# Patient Record
Sex: Female | Born: 1935 | Race: White | Hispanic: No | Marital: Single | Smoking: Never smoker
Health system: Southern US, Community
[De-identification: ages and names within clinical notes are randomized; demographics above are authoritative.]

## PROBLEM LIST (undated history)

## (undated) DIAGNOSIS — I10 Essential (primary) hypertension: Secondary | ICD-10-CM

## (undated) DIAGNOSIS — E7800 Pure hypercholesterolemia, unspecified: Secondary | ICD-10-CM

---

## 2003-10-18 ENCOUNTER — Other Ambulatory Visit: Admission: RE | Admit: 2003-10-18 | Discharge: 2003-10-18 | Payer: Self-pay | Admitting: Internal Medicine

## 2003-10-21 ENCOUNTER — Ambulatory Visit (HOSPITAL_COMMUNITY): Admission: RE | Admit: 2003-10-21 | Discharge: 2003-10-21 | Payer: Self-pay | Admitting: Family Medicine

## 2005-02-22 ENCOUNTER — Ambulatory Visit (HOSPITAL_COMMUNITY): Admission: RE | Admit: 2005-02-22 | Discharge: 2005-02-22 | Payer: Self-pay | Admitting: Pulmonary Disease

## 2005-03-23 ENCOUNTER — Ambulatory Visit (HOSPITAL_COMMUNITY): Admission: RE | Admit: 2005-03-23 | Discharge: 2005-03-23 | Payer: Self-pay | Admitting: Pulmonary Disease

## 2005-07-26 ENCOUNTER — Ambulatory Visit (HOSPITAL_COMMUNITY): Admission: RE | Admit: 2005-07-26 | Discharge: 2005-07-26 | Payer: Self-pay | Admitting: Obstetrics & Gynecology

## 2008-02-17 ENCOUNTER — Ambulatory Visit (HOSPITAL_COMMUNITY): Admission: RE | Admit: 2008-02-17 | Discharge: 2008-02-17 | Payer: Self-pay | Admitting: Pulmonary Disease

## 2009-02-17 ENCOUNTER — Ambulatory Visit (HOSPITAL_COMMUNITY): Admission: RE | Admit: 2009-02-17 | Discharge: 2009-02-17 | Payer: Self-pay | Admitting: Pulmonary Disease

## 2010-02-23 ENCOUNTER — Ambulatory Visit (HOSPITAL_COMMUNITY): Admission: RE | Admit: 2010-02-23 | Discharge: 2010-02-23 | Payer: Self-pay | Admitting: Pulmonary Disease

## 2010-04-30 ENCOUNTER — Encounter: Payer: Self-pay | Admitting: Obstetrics & Gynecology

## 2011-02-13 ENCOUNTER — Other Ambulatory Visit (HOSPITAL_COMMUNITY): Payer: Self-pay | Admitting: Pulmonary Disease

## 2011-02-13 DIAGNOSIS — Z1231 Encounter for screening mammogram for malignant neoplasm of breast: Secondary | ICD-10-CM

## 2011-03-20 ENCOUNTER — Ambulatory Visit (HOSPITAL_COMMUNITY)
Admission: RE | Admit: 2011-03-20 | Discharge: 2011-03-20 | Disposition: A | Discharge status: Home / Self Care | Payer: Medicare Other | Source: Ambulatory Visit | Attending: Pulmonary Disease | Admitting: Pulmonary Disease

## 2011-03-20 DIAGNOSIS — Z1231 Encounter for screening mammogram for malignant neoplasm of breast: Secondary | ICD-10-CM | POA: Insufficient documentation

## 2011-05-17 ENCOUNTER — Other Ambulatory Visit: Payer: Self-pay | Admitting: Pulmonary Disease

## 2011-05-17 DIAGNOSIS — Z780 Asymptomatic menopausal state: Secondary | ICD-10-CM

## 2011-05-23 ENCOUNTER — Other Ambulatory Visit: Payer: Medicare Other

## 2011-05-23 ENCOUNTER — Ambulatory Visit
Admission: RE | Admit: 2011-05-23 | Discharge: 2011-05-23 | Disposition: A | Discharge status: Home / Self Care | Payer: Medicare Other | Source: Ambulatory Visit | Attending: Pulmonary Disease | Admitting: Pulmonary Disease

## 2011-05-23 DIAGNOSIS — Z780 Asymptomatic menopausal state: Secondary | ICD-10-CM

## 2012-03-12 ENCOUNTER — Other Ambulatory Visit (HOSPITAL_COMMUNITY): Payer: Self-pay | Admitting: Pulmonary Disease

## 2012-03-12 DIAGNOSIS — Z1231 Encounter for screening mammogram for malignant neoplasm of breast: Secondary | ICD-10-CM

## 2012-04-03 ENCOUNTER — Ambulatory Visit (HOSPITAL_COMMUNITY)
Admission: RE | Admit: 2012-04-03 | Discharge: 2012-04-03 | Disposition: A | Discharge status: Home / Self Care | Payer: Medicare Other | Source: Ambulatory Visit | Attending: Pulmonary Disease | Admitting: Pulmonary Disease

## 2012-04-03 DIAGNOSIS — Z1231 Encounter for screening mammogram for malignant neoplasm of breast: Secondary | ICD-10-CM | POA: Insufficient documentation

## 2012-09-30 ENCOUNTER — Other Ambulatory Visit: Payer: Self-pay | Admitting: Gastroenterology

## 2013-03-16 ENCOUNTER — Other Ambulatory Visit (HOSPITAL_COMMUNITY): Payer: Self-pay | Admitting: Pulmonary Disease

## 2013-03-16 DIAGNOSIS — Z1231 Encounter for screening mammogram for malignant neoplasm of breast: Secondary | ICD-10-CM

## 2013-04-07 ENCOUNTER — Ambulatory Visit (HOSPITAL_COMMUNITY)
Admission: RE | Admit: 2013-04-07 | Discharge: 2013-04-07 | Disposition: A | Discharge status: Home / Self Care | Payer: Medicare Other | Source: Ambulatory Visit | Attending: Pulmonary Disease | Admitting: Pulmonary Disease

## 2013-04-07 DIAGNOSIS — Z1231 Encounter for screening mammogram for malignant neoplasm of breast: Secondary | ICD-10-CM | POA: Insufficient documentation

## 2013-05-25 ENCOUNTER — Ambulatory Visit
Admission: RE | Admit: 2013-05-25 | Discharge: 2013-05-25 | Disposition: A | Discharge status: Home / Self Care | Payer: Medicare Other | Source: Ambulatory Visit | Attending: Pulmonary Disease | Admitting: Pulmonary Disease

## 2013-05-25 ENCOUNTER — Other Ambulatory Visit: Payer: Self-pay | Admitting: Pulmonary Disease

## 2013-05-25 DIAGNOSIS — M79659 Pain in unspecified thigh: Secondary | ICD-10-CM

## 2014-03-22 ENCOUNTER — Other Ambulatory Visit (HOSPITAL_COMMUNITY): Payer: Self-pay | Admitting: Pulmonary Disease

## 2014-03-22 DIAGNOSIS — Z1231 Encounter for screening mammogram for malignant neoplasm of breast: Secondary | ICD-10-CM

## 2014-04-08 ENCOUNTER — Ambulatory Visit (HOSPITAL_COMMUNITY): Payer: Medicare Other

## 2014-04-08 ENCOUNTER — Ambulatory Visit (HOSPITAL_COMMUNITY)
Admission: RE | Admit: 2014-04-08 | Discharge: 2014-04-08 | Disposition: A | Discharge status: Home / Self Care | Payer: Medicare Other | Source: Ambulatory Visit | Attending: Pulmonary Disease | Admitting: Pulmonary Disease

## 2014-04-08 DIAGNOSIS — Z1231 Encounter for screening mammogram for malignant neoplasm of breast: Secondary | ICD-10-CM | POA: Diagnosis present

## 2014-06-16 ENCOUNTER — Emergency Department (HOSPITAL_COMMUNITY): Payer: Medicare Other

## 2014-06-16 ENCOUNTER — Emergency Department (HOSPITAL_COMMUNITY)
Admission: EM | Admit: 2014-06-16 | Discharge: 2014-06-17 | Disposition: A | Discharge status: Home / Self Care | Payer: Medicare Other | Attending: Emergency Medicine | Admitting: Emergency Medicine

## 2014-06-16 ENCOUNTER — Encounter (HOSPITAL_COMMUNITY): Payer: Self-pay | Admitting: Emergency Medicine

## 2014-06-16 DIAGNOSIS — Z23 Encounter for immunization: Secondary | ICD-10-CM | POA: Diagnosis not present

## 2014-06-16 DIAGNOSIS — S0011XA Contusion of right eyelid and periocular area, initial encounter: Secondary | ICD-10-CM | POA: Diagnosis not present

## 2014-06-16 DIAGNOSIS — S0083XA Contusion of other part of head, initial encounter: Secondary | ICD-10-CM | POA: Diagnosis not present

## 2014-06-16 DIAGNOSIS — I10 Essential (primary) hypertension: Secondary | ICD-10-CM | POA: Insufficient documentation

## 2014-06-16 DIAGNOSIS — E780 Pure hypercholesterolemia: Secondary | ICD-10-CM | POA: Diagnosis not present

## 2014-06-16 DIAGNOSIS — S0012XA Contusion of left eyelid and periocular area, initial encounter: Secondary | ICD-10-CM | POA: Diagnosis not present

## 2014-06-16 DIAGNOSIS — Y92192 Bathroom in other specified residential institution as the place of occurrence of the external cause: Secondary | ICD-10-CM | POA: Insufficient documentation

## 2014-06-16 DIAGNOSIS — Z79899 Other long term (current) drug therapy: Secondary | ICD-10-CM | POA: Diagnosis not present

## 2014-06-16 DIAGNOSIS — S02402A Zygomatic fracture, unspecified, initial encounter for closed fracture: Secondary | ICD-10-CM | POA: Diagnosis not present

## 2014-06-16 DIAGNOSIS — Y9389 Activity, other specified: Secondary | ICD-10-CM | POA: Diagnosis not present

## 2014-06-16 DIAGNOSIS — R58 Hemorrhage, not elsewhere classified: Secondary | ICD-10-CM

## 2014-06-16 DIAGNOSIS — W1839XA Other fall on same level, initial encounter: Secondary | ICD-10-CM | POA: Insufficient documentation

## 2014-06-16 DIAGNOSIS — Y998 Other external cause status: Secondary | ICD-10-CM | POA: Insufficient documentation

## 2014-06-16 DIAGNOSIS — W19XXXA Unspecified fall, initial encounter: Secondary | ICD-10-CM

## 2014-06-16 DIAGNOSIS — S0993XA Unspecified injury of face, initial encounter: Secondary | ICD-10-CM | POA: Diagnosis present

## 2014-06-16 HISTORY — DX: Essential (primary) hypertension: I10

## 2014-06-16 HISTORY — DX: Pure hypercholesterolemia, unspecified: E78.00

## 2014-06-16 MED ORDER — TETANUS-DIPHTH-ACELL PERTUSSIS 5-2.5-18.5 LF-MCG/0.5 IM SUSP
0.5000 mL | Freq: Once | INTRAMUSCULAR | Status: AC
  Administered 2014-06-16: 0.5 mL via INTRAMUSCULAR
  Filled 2014-06-16: qty 0.5

## 2014-06-16 NOTE — ED Notes (Signed)
Pt reports she was using the BR at home last night with her walker when she fell face forward.  Presents with bila eye hematoma from the fall, hematoma noted to forehead and face.  Pt denies any pain at this time.  Pt denies any pain anywhere else.

## 2014-06-16 NOTE — ED Notes (Signed)
Pt states she went into the bathroom last night using her walker and fell  Pt states she fell forward and landed on the floor face first  Pt has bruising and swelling noted to her forehead, both eyes, and the bridge of her nose

## 2014-06-16 NOTE — ED Provider Notes (Signed)
CSN: [id number]     Arrival date & time 06/16/14  2011 History   First MD Initiated Contact with Patient 06/16/14 2303     Chief Complaint  Patient presents with  . Fall     (Consider location/radiation/quality/duration/timing/severity/associated sxs/prior Treatment) Patient is a 81 y.o. female presenting with fall. The history is provided by the patient.  Fall This is a new problem. The current episode started yesterday (36 hours ago). The problem occurs constantly. The problem has not changed since onset.Pertinent negatives include no chest pain, no abdominal pain, no headaches and no shortness of breath. Nothing aggravates the symptoms. Nothing relieves the symptoms. She has tried nothing for the symptoms. The treatment provided no relief.    Past Medical History  Diagnosis Date  . Hypertension   . High cholesterol    History reviewed. No pertinent past surgical history. Family History  Problem Relation Age of Onset  . Family history unknown: Yes   History  Substance Use Topics  . Smoking status: Never Smoker   . Smokeless tobacco: Not on file  . Alcohol Use: No   OB History    No data available     Review of Systems  Respiratory: Negative for shortness of breath.   Cardiovascular: Negative for chest pain.  Gastrointestinal: Negative for abdominal pain.  Neurological: Negative for headaches.  All other systems reviewed and are negative.     Allergies  Review of patient's allergies indicates no known allergies.  Home Medications   Prior to Admission medications   Medication Sig Start Date End Date Taking? Authorizing Provider  Cholecalciferol (VITAMIN D3) 50000 UNITS CAPS Take 1 capsule by mouth once a week. On Sundays.   Yes Historical Provider, MD  olmesartan (BENICAR) 20 MG tablet Take 20 mg by mouth daily.   Yes Historical Provider, MD  rosuvastatin (CRESTOR) 20 MG tablet Take 20 mg by mouth at bedtime.   Yes Historical Provider, MD   BP 174/52 mmHg   Pulse 84  Temp(Src) 98.4 F (36.9 C) (Oral)  Resp 18  SpO2 100% Physical Exam  Constitutional: She is oriented to person, place, and time. She appears well-developed and well-nourished. No distress.  HENT:  Head: Normocephalic. Head is without Battle's sign.    Right Ear: No mastoid tenderness. No hemotympanum.  Left Ear: No mastoid tenderness. No hemotympanum.  Mouth/Throat: Oropharynx is clear and moist. No oropharyngeal exudate.  Eyes: Conjunctivae and EOM are normal. Pupils are equal, round, and reactive to light.  Neck: Normal range of motion. Neck supple.  Cardiovascular: Normal rate and regular rhythm.   Pulmonary/Chest: Effort normal and breath sounds normal. No respiratory distress. She has no wheezes. She has no rales.  Abdominal: Soft. Bowel sounds are normal. There is no tenderness. There is no rebound and no guarding.  Musculoskeletal: Normal range of motion. She exhibits no edema or tenderness.  Neurological: She is alert and oriented to person, place, and time.  Skin: Skin is warm and dry.  Psychiatric: She has a normal mood and affect.    ED Course  Procedures (including critical care time) Labs Review Labs Reviewed - No data to display  Imaging Review No results found.   EKG Interpretation None      MDM   Final diagnoses:  Fall    Ibuprofen and ice and amoxicillin and close follow up with ENT for zygomatic arch fracture.  Patient and family verbalize understanding and agree to follow up   Fritzi Scripter, MD 06/17/14 9211

## 2014-06-17 ENCOUNTER — Telehealth: Payer: Self-pay | Admitting: *Deleted

## 2014-06-17 ENCOUNTER — Encounter (HOSPITAL_COMMUNITY): Payer: Self-pay | Admitting: Emergency Medicine

## 2014-06-17 MED ORDER — AMOXICILLIN-POT CLAVULANATE 875-125 MG PO TABS
1.0000 | ORAL_TABLET | Freq: Once | ORAL | Status: AC
  Administered 2014-06-17: 1 via ORAL
  Filled 2014-06-17: qty 1

## 2014-06-17 MED ORDER — AMOXICILLIN 500 MG PO CAPS: 500.0000 mg | ORAL_CAPSULE | Freq: Three times a day (TID) | ORAL | Status: DC

## 2014-06-17 MED ORDER — IBUPROFEN 400 MG PO TABS: 400.0000 mg | ORAL_TABLET | Freq: Four times a day (QID) | ORAL | Status: DC | PRN

## 2014-06-17 NOTE — Discharge Instructions (Signed)
Facial Fracture A facial fracture is a break in one of the bones of your face. HOME CARE INSTRUCTIONS   Protect the injured part of your face until it is healed.  Do not participate in activities which give chance for re-injury until your doctor approves.  Gently wash and dry your face.  Wear head and facial protection while riding a bicycle, motorcycle, or snowmobile. SEEK MEDICAL CARE IF:   An oral temperature above 102 F (38.9 C) develops.  You have severe headaches or notice changes in your vision.  You have new numbness or tingling in your face.  You develop nausea (feeling sick to your stomach), vomiting or a stiff neck. SEEK IMMEDIATE MEDICAL CARE IF:   You develop difficulty seeing or experience double vision.  You become dizzy, lightheaded, or faint.  You develop trouble speaking, breathing, or swallowing.  You have a watery discharge from your nose or ear. MAKE SURE YOU:   Understand these instructions.  Will watch your condition.  Will get help right away if you are not doing well or get worse. Document Released: 03/26/2005 Document Revised: 06/18/2011 Document Reviewed: 11/13/2007 [facility] Patient Information 2015 [place], [place]. This information is not intended to replace advice given to you by your health care provider. Make sure you discuss any questions you have with your health care provider.

## 2014-06-17 NOTE — Telephone Encounter (Signed)
Pharmacist called to verify unsigned prescription.  NCM verified pt ED admission 06/16/14 and prescription written.

## 2015-03-30 ENCOUNTER — Other Ambulatory Visit: Payer: Self-pay

## 2015-03-30 DIAGNOSIS — Z1231 Encounter for screening mammogram for malignant neoplasm of breast: Secondary | ICD-10-CM

## 2015-04-25 ENCOUNTER — Ambulatory Visit: Payer: Medicare Other

## 2015-05-04 ENCOUNTER — Ambulatory Visit
Admission: RE | Admit: 2015-05-04 | Discharge: 2015-05-04 | Disposition: A | Discharge status: Home / Self Care | Payer: Medicare Other | Source: Ambulatory Visit

## 2015-05-04 DIAGNOSIS — Z1231 Encounter for screening mammogram for malignant neoplasm of breast: Secondary | ICD-10-CM

## 2016-06-08 ENCOUNTER — Other Ambulatory Visit: Payer: Self-pay | Admitting: Pulmonary Disease

## 2016-06-08 DIAGNOSIS — Z1231 Encounter for screening mammogram for malignant neoplasm of breast: Secondary | ICD-10-CM

## 2016-07-09 ENCOUNTER — Ambulatory Visit: Payer: Medicare Other

## 2016-07-31 ENCOUNTER — Ambulatory Visit: Payer: Medicare Other

## 2016-08-17 ENCOUNTER — Ambulatory Visit
Admission: RE | Admit: 2016-08-17 | Discharge: 2016-08-17 | Disposition: A | Discharge status: Home / Self Care | Payer: Medicare Other | Source: Ambulatory Visit | Attending: Pulmonary Disease | Admitting: Pulmonary Disease

## 2016-08-17 DIAGNOSIS — Z1231 Encounter for screening mammogram for malignant neoplasm of breast: Secondary | ICD-10-CM

## 2016-11-11 ENCOUNTER — Inpatient Hospital Stay (HOSPITAL_COMMUNITY): Admit: 2016-11-11 | Payer: Medicare Other

## 2016-11-11 ENCOUNTER — Encounter (HOSPITAL_COMMUNITY): Payer: Self-pay | Admitting: *Deleted

## 2016-11-11 ENCOUNTER — Inpatient Hospital Stay (HOSPITAL_COMMUNITY)
Admission: EM | Admit: 2016-11-11 | Discharge: 2016-11-17 | DRG: 683 | Disposition: A | Discharge status: Home / Self Care | Payer: Medicare Other | Attending: Internal Medicine | Admitting: Internal Medicine

## 2016-11-11 ENCOUNTER — Emergency Department (HOSPITAL_COMMUNITY): Payer: Medicare Other

## 2016-11-11 DIAGNOSIS — N189 Chronic kidney disease, unspecified: Secondary | ICD-10-CM | POA: Diagnosis present

## 2016-11-11 DIAGNOSIS — B9620 Unspecified Escherichia coli [E. coli] as the cause of diseases classified elsewhere: Secondary | ICD-10-CM | POA: Diagnosis present

## 2016-11-11 DIAGNOSIS — E860 Dehydration: Secondary | ICD-10-CM | POA: Diagnosis present

## 2016-11-11 DIAGNOSIS — R112 Nausea with vomiting, unspecified: Secondary | ICD-10-CM | POA: Diagnosis not present

## 2016-11-11 DIAGNOSIS — Z515 Encounter for palliative care: Secondary | ICD-10-CM | POA: Diagnosis not present

## 2016-11-11 DIAGNOSIS — I129 Hypertensive chronic kidney disease with stage 1 through stage 4 chronic kidney disease, or unspecified chronic kidney disease: Secondary | ICD-10-CM | POA: Diagnosis present

## 2016-11-11 DIAGNOSIS — E872 Acidosis: Secondary | ICD-10-CM | POA: Diagnosis present

## 2016-11-11 DIAGNOSIS — Z79899 Other long term (current) drug therapy: Secondary | ICD-10-CM | POA: Diagnosis not present

## 2016-11-11 DIAGNOSIS — F502 Bulimia nervosa: Secondary | ICD-10-CM | POA: Diagnosis not present

## 2016-11-11 DIAGNOSIS — I82412 Acute embolism and thrombosis of left femoral vein: Secondary | ICD-10-CM | POA: Diagnosis present

## 2016-11-11 DIAGNOSIS — R1010 Upper abdominal pain, unspecified: Secondary | ICD-10-CM | POA: Diagnosis not present

## 2016-11-11 DIAGNOSIS — Z66 Do not resuscitate: Secondary | ICD-10-CM | POA: Diagnosis not present

## 2016-11-11 DIAGNOSIS — K869 Disease of pancreas, unspecified: Secondary | ICD-10-CM | POA: Diagnosis not present

## 2016-11-11 DIAGNOSIS — E7800 Pure hypercholesterolemia, unspecified: Secondary | ICD-10-CM | POA: Diagnosis present

## 2016-11-11 DIAGNOSIS — R6889 Other general symptoms and signs: Secondary | ICD-10-CM | POA: Diagnosis not present

## 2016-11-11 DIAGNOSIS — R630 Anorexia: Secondary | ICD-10-CM | POA: Diagnosis not present

## 2016-11-11 DIAGNOSIS — R188 Other ascites: Secondary | ICD-10-CM | POA: Diagnosis present

## 2016-11-11 DIAGNOSIS — N10 Acute tubulo-interstitial nephritis: Secondary | ICD-10-CM | POA: Diagnosis not present

## 2016-11-11 DIAGNOSIS — K769 Liver disease, unspecified: Secondary | ICD-10-CM | POA: Diagnosis present

## 2016-11-11 DIAGNOSIS — R531 Weakness: Secondary | ICD-10-CM | POA: Diagnosis present

## 2016-11-11 DIAGNOSIS — R933 Abnormal findings on diagnostic imaging of other parts of digestive tract: Secondary | ICD-10-CM | POA: Diagnosis not present

## 2016-11-11 DIAGNOSIS — I82409 Acute embolism and thrombosis of unspecified deep veins of unspecified lower extremity: Secondary | ICD-10-CM | POA: Diagnosis not present

## 2016-11-11 DIAGNOSIS — E785 Hyperlipidemia, unspecified: Secondary | ICD-10-CM | POA: Diagnosis present

## 2016-11-11 DIAGNOSIS — N179 Acute kidney failure, unspecified: Principal | ICD-10-CM | POA: Diagnosis present

## 2016-11-11 DIAGNOSIS — Z23 Encounter for immunization: Secondary | ICD-10-CM

## 2016-11-11 DIAGNOSIS — N390 Urinary tract infection, site not specified: Secondary | ICD-10-CM | POA: Diagnosis present

## 2016-11-11 DIAGNOSIS — I2510 Atherosclerotic heart disease of native coronary artery without angina pectoris: Secondary | ICD-10-CM | POA: Diagnosis present

## 2016-11-11 DIAGNOSIS — C799 Secondary malignant neoplasm of unspecified site: Secondary | ICD-10-CM

## 2016-11-11 DIAGNOSIS — I82402 Acute embolism and thrombosis of unspecified deep veins of left lower extremity: Secondary | ICD-10-CM | POA: Diagnosis not present

## 2016-11-11 DIAGNOSIS — C259 Malignant neoplasm of pancreas, unspecified: Secondary | ICD-10-CM | POA: Diagnosis not present

## 2016-11-11 DIAGNOSIS — K8689 Other specified diseases of pancreas: Secondary | ICD-10-CM | POA: Diagnosis present

## 2016-11-11 DIAGNOSIS — N3000 Acute cystitis without hematuria: Secondary | ICD-10-CM | POA: Diagnosis not present

## 2016-11-11 DIAGNOSIS — I10 Essential (primary) hypertension: Secondary | ICD-10-CM | POA: Diagnosis present

## 2016-11-11 DIAGNOSIS — C787 Secondary malignant neoplasm of liver and intrahepatic bile duct: Secondary | ICD-10-CM | POA: Diagnosis present

## 2016-11-11 DIAGNOSIS — Z7189 Other specified counseling: Secondary | ICD-10-CM | POA: Diagnosis not present

## 2016-11-11 DIAGNOSIS — D638 Anemia in other chronic diseases classified elsewhere: Secondary | ICD-10-CM | POA: Diagnosis present

## 2016-11-11 DIAGNOSIS — R54 Age-related physical debility: Secondary | ICD-10-CM | POA: Diagnosis present

## 2016-11-11 DIAGNOSIS — I82401 Acute embolism and thrombosis of unspecified deep veins of right lower extremity: Secondary | ICD-10-CM | POA: Diagnosis not present

## 2016-11-11 DIAGNOSIS — C252 Malignant neoplasm of tail of pancreas: Secondary | ICD-10-CM | POA: Diagnosis present

## 2016-11-11 LAB — HEPATIC FUNCTION PANEL
ALK PHOS: 322 U/L — AB (ref 38–126)
ALT: 16 U/L (ref 14–54)
AST: 26 U/L (ref 15–41)
Albumin: 2.9 g/dL — ABNORMAL LOW (ref 3.5–5.0)
BILIRUBIN DIRECT: 0.2 mg/dL (ref 0.1–0.5)
BILIRUBIN TOTAL: 1.2 mg/dL (ref 0.3–1.2)
Indirect Bilirubin: 1 mg/dL — ABNORMAL HIGH (ref 0.3–0.9)
Total Protein: 7.4 g/dL (ref 6.5–8.1)

## 2016-11-11 LAB — URINALYSIS, ROUTINE W REFLEX MICROSCOPIC
BILIRUBIN URINE: NEGATIVE
GLUCOSE, UA: NEGATIVE mg/dL
KETONES UR: 15 mg/dL — AB
Nitrite: POSITIVE — AB
PH: 5 (ref 5.0–8.0)
Protein, ur: NEGATIVE mg/dL
Specific Gravity, Urine: 1.01 (ref 1.005–1.030)

## 2016-11-11 LAB — CBC
HEMATOCRIT: 37.5 % (ref 36.0–46.0)
Hemoglobin: 12.1 g/dL (ref 12.0–15.0)
MCH: 27.4 pg (ref 26.0–34.0)
MCHC: 32.3 g/dL (ref 30.0–36.0)
MCV: 85 fL (ref 78.0–100.0)
Platelets: 267 10*3/uL (ref 150–400)
RBC: 4.41 MIL/uL (ref 3.87–5.11)
RDW: 15.9 % — ABNORMAL HIGH (ref 11.5–15.5)
WBC: 15.9 10*3/uL — ABNORMAL HIGH (ref 4.0–10.5)

## 2016-11-11 LAB — BASIC METABOLIC PANEL
Anion gap: 18 — ABNORMAL HIGH (ref 5–15)
BUN: 56 mg/dL — AB (ref 6–20)
CALCIUM: 9.8 mg/dL (ref 8.9–10.3)
CHLORIDE: 101 mmol/L (ref 101–111)
CO2: 18 mmol/L — AB (ref 22–32)
CREATININE: 1.5 mg/dL — AB (ref 0.44–1.00)
GFR calc Af Amer: 37 mL/min — ABNORMAL LOW (ref >60)
GFR calc non Af Amer: 32 mL/min — ABNORMAL LOW (ref >60)
GLUCOSE: 123 mg/dL — AB (ref 65–99)
Potassium: 5.2 mmol/L — ABNORMAL HIGH (ref 3.5–5.1)
Sodium: 137 mmol/L (ref 135–145)

## 2016-11-11 LAB — URINALYSIS, MICROSCOPIC (REFLEX)

## 2016-11-11 LAB — LIPASE, BLOOD: LIPASE: 19 U/L (ref 11–51)

## 2016-11-11 LAB — MAGNESIUM: MAGNESIUM: 2.3 mg/dL (ref 1.7–2.4)

## 2016-11-11 MED ORDER — HEPARIN (PORCINE) IN NACL 100-0.45 UNIT/ML-% IJ SOLN
800.0000 [IU]/h | INTRAMUSCULAR | Status: DC
  Administered 2016-11-11: 800 [IU]/h via INTRAVENOUS
  Filled 2016-11-11: qty 250

## 2016-11-11 MED ORDER — ONDANSETRON HCL 4 MG/2ML IJ SOLN
4.0000 mg | Freq: Four times a day (QID) | INTRAMUSCULAR | Status: DC | PRN
  Administered 2016-11-13 – 2016-11-14 (×2): 4 mg via INTRAVENOUS
  Filled 2016-11-11 (×2): qty 2

## 2016-11-11 MED ORDER — PNEUMOCOCCAL VAC POLYVALENT 25 MCG/0.5ML IJ INJ
0.5000 mL | INJECTION | INTRAMUSCULAR | Status: AC
  Administered 2016-11-12: 0.5 mL via INTRAMUSCULAR
  Filled 2016-11-11: qty 0.5

## 2016-11-11 MED ORDER — HEPARIN BOLUS VIA INFUSION
2500.0000 [IU] | Freq: Once | INTRAVENOUS | Status: AC
  Administered 2016-11-11: 2500 [IU] via INTRAVENOUS
  Filled 2016-11-11: qty 2500

## 2016-11-11 MED ORDER — DEXTROSE 5 % IV SOLN
1.0000 g | INTRAVENOUS | Status: DC
  Administered 2016-11-11 – 2016-11-12 (×2): 1 g via INTRAVENOUS
  Filled 2016-11-11 (×2): qty 10

## 2016-11-11 MED ORDER — ACETAMINOPHEN 650 MG RE SUPP: 650.0000 mg | Freq: Four times a day (QID) | RECTAL | Status: DC | PRN

## 2016-11-11 MED ORDER — ACETAMINOPHEN 325 MG PO TABS: 650.0000 mg | ORAL_TABLET | Freq: Four times a day (QID) | ORAL | Status: DC | PRN

## 2016-11-11 MED ORDER — SODIUM CHLORIDE 0.9 % IV BOLUS (SEPSIS)
1000.0000 mL | Freq: Once | INTRAVENOUS | Status: AC
  Administered 2016-11-11: 1000 mL via INTRAVENOUS

## 2016-11-11 MED ORDER — PROMETHAZINE HCL 25 MG/ML IJ SOLN: 6.2500 mg | Freq: Four times a day (QID) | INTRAMUSCULAR | Status: DC | PRN

## 2016-11-11 MED ORDER — SODIUM CHLORIDE 0.9 % IV SOLN
INTRAVENOUS | Status: DC
  Administered 2016-11-11 – 2016-11-14 (×4): via INTRAVENOUS

## 2016-11-11 MED ORDER — SODIUM CHLORIDE 0.9% FLUSH
3.0000 mL | Freq: Two times a day (BID) | INTRAVENOUS | Status: DC
  Administered 2016-11-12 – 2016-11-16 (×8): 3 mL via INTRAVENOUS

## 2016-11-11 MED ORDER — IOPAMIDOL (ISOVUE-300) INJECTION 61%
INTRAVENOUS | Status: AC
  Administered 2016-11-11: 75 mL
  Filled 2016-11-11: qty 75

## 2016-11-11 NOTE — Progress Notes (Signed)
New Admission Note:   Arrival Method: Bed Mental Orientation: A&O X3 Telemetry: Initiated Assessment: Completed Skin: See flowsheets IV: Infusing Pain: Denies Safety Measures: Safety Fall Prevention Plan has been given, discussed and signed Admission: Completed Unit Orientation: Patient has been orientated to the room, unit and staff.   Orders have been reviewed and implemented. Will continue to monitor the patient. Call light has been placed within reach and bed alarm has been activated.    Dixie Dials RN, BSN

## 2016-11-11 NOTE — Progress Notes (Signed)
ANTICOAGULATION CONSULT NOTE - Initial Consult  Pharmacy Consult for Heparin Indication: DVT  No Known Allergies  Patient Measurements: Height: 5\' 2"  (157.5 cm) Weight: 110 lb (49.9 kg) IBW/kg (Calculated) : 50.1  Heparin Dosing Weight: 50 kg  Vital Signs: Temp: 95.4 F (35.2 C) (08/05 1210) Temp Source: Axillary (08/05 1210) BP: 106/67 (08/05 1515) Pulse Rate: 72 (08/05 1514)  Labs:  Recent Labs  11/11/16 1219  HGB 12.1  HCT 37.5  PLT 267  CREATININE 1.50*    Estimated Creatinine Clearance: 23.6 mL/min (A) (by C-G formula based on SCr of 1.5 mg/dL (H)).   Medical History: Past Medical History:  Diagnosis Date  . High cholesterol   . Hypertension     Assessment: 84 YOF who presented on 8/5 with complaints of general weakness, nausea, and decreased appetite for several weeks. The patient was also found to have a DVT and pharmacy was consulted to start heparin for anticoagulation. Baseline CBC wnl, Hep Wt: 50 kg, not on any anticoagulation PTA.    Goal of Therapy:  Heparin level 0.3-0.7 units/ml Monitor platelets by anticoagulation protocol: Yes   Plan:  1. Heparin bolus of 2500 units x 1 2. Start heparin at a rate of 800 units/hr (8 ml/hr) 3. Daily HL, CBC 4. Will continue to monitor for any signs/symptoms of bleeding and will follow up with heparin level in 8 hours   Thank you for allowing pharmacy to be a part of this patient's care.  Alycia Rossetti, PharmD, BCPS Clinical Pharmacist Pager: [phone] Clinical phone for 11/11/2016 from 7a-3:30p: [phone] If after 3:30p, please call main pharmacy at: [phone] 11/11/2016 4:18 PM

## 2016-11-11 NOTE — H&P (Signed)
History and Physical    Alexandra Mcmahon CVE:[id number] DOB: 20-Apr-1933 DOA: 11/11/2016   PCP: Vincente Liberty, MD   Attending physician: Aggie Moats  Patient coming from/Resides with: Private residence/alone  Chief Complaint: Nausea and vomiting and generalized weakness  HPI: Alexandra Mcmahon is a 83 y.o. female with medical history significant for hypertension and dyslipidemia who presented to the ER complaining of generalized weakness after expressing nausea and vomiting for 2 weeks with last emesis episode 3 days ago. Emesis has been nonbloody. She's not had any abdominal pain. Labs in the ER consistent with acute kidney injury. Unfortunately CT imaging of the abdomen and pelvis did reveal a 3.5 cm pancreatic tail lesion as well as multiple lesions on the liver concerning for metastatic disease. There also was question of a DVT involving the left common femoral vein. Patient and family updated on CT findings by EDP. Patient will be admitted for further evaluation and treatment. Patient thinks she has lost weight since onset of symptoms.  ED Course:  Vital Signs: BP 106/67   Pulse 72   Temp (!) 95.4 F (35.2 C) (Axillary) Comment: unable to get reading orally  Resp 13   SpO2 100%  CT abd/pelvis: 3.5 cm pancreatic lesion with innumerable liver lesions most compatible with metastatic pancreatic cancer with radiologist documenting liver lesions would be amenable to percutaneous biopsy. Also concern for DVT left common femoral vein. There are also indeterminate nodules and densities at the left lung base so question metastatic disease as well. Lab data: Sodium 137, potassium 5.2, chloride 101, CO2 18, glucose 123, BUN 56, creatinine 1.5, anion gap 18, alkaline phosphatase 322, albumin 2.9, other LFTs normal except for subtle elevation of indirect bilirubin to 1.0. WBC 15,900 differential not obtained, hemoglobin 12.1, platelets 267,000; urinalysis abnormal: Many bacteria, trace hemoglobin, 15 ketones, large  leukocytes, positive nitrite, squamous epithelial 6-30, WBC too numerous to count Medications and treatments: Normal saline bolus 2 L  Review of Systems:  In addition to the HPI above,  No Fever-chills, myalgias or other constitutional symptoms No Headache, changes with Vision or hearing, new weakness, tingling, numbness in any extremity, dizziness, dysarthria or word finding difficulty, tremors or seizure activity No problems swallowing food or Liquids, indigestion/reflux, choking or coughing while eating, abdominal pain with or after eating No Chest pain, Cough or Shortness of Breath, palpitations, orthopnea or DOE No Abdominal pain, melena,hematochezia, dark tarry stools, constipation No dysuria, hematuria or flank pain No new skin rashes, lesions, masses or bruises, No new joint pains, aches, swelling or redness No recent unintentional weight gain No polyuria, polydypsia or polyphagia   Past Medical History:  Diagnosis Date  . High cholesterol   . Hypertension     No past surgical history on file.  Social History   Social History  . Marital status: Single    Spouse name: N/A  . Number of children: N/A  . Years of education: N/A   Occupational History  . Not on file.   Social History Main Topics  . Smoking status: Never Smoker  . Smokeless tobacco: Not on file  . Alcohol use No  . Drug use: No  . Sexual activity: Not on file   Other Topics Concern  . Not on file   Social History Narrative  . No narrative on file    Mobility: RW Work history: Not obtained   No Known Allergies  Family History  Problem Relation Age of Onset  . Breast cancer Neg Hx  Prior to Admission medications   Medication Sig Start Date End Date Taking? Authorizing Provider  amoxicillin (AMOXIL) 500 MG capsule Take 1 capsule (500 mg total) by mouth 3 (three) times daily. 06/17/14   Palumbo, April, MD  Cholecalciferol (VITAMIN D3) 50000 UNITS CAPS Take 1 capsule by mouth once a  week. On Sundays.    [provider]  ibuprofen (ADVIL,MOTRIN) 400 MG tablet Take 1 tablet (400 mg total) by mouth every 6 (six) hours as needed. 06/17/14   Palumbo, April, MD  olmesartan (BENICAR) 20 MG tablet Take 20 mg by mouth daily.    [provider]  rosuvastatin (CRESTOR) 20 MG tablet Take 20 mg by mouth at bedtime.    [provider]    Physical Exam: Vitals:   11/11/16 1445 11/11/16 1500 11/11/16 1514 11/11/16 1515  BP: 129/87 106/77 106/77 106/67  Pulse:  74 72   Resp: '13 13 13 13  '$ Temp:      TempSrc:      SpO2:   100%       Constitutional: NAD, calm, comfortable-Appears pale and underwent Eyes: PERRL, lids and conjunctivae normal ENMT: Mucous membranes are dry. Posterior pharynx clear of any exudate or lesions. Edentulous Neck: normal, supple, no masses, no thyromegaly Respiratory: clear to auscultation bilaterally, no wheezing, no crackles. Normal respiratory effort. No accessory muscle use.  Cardiovascular: Regular rate and rhythm, no murmurs / rubs / gallops. No extremity edema. 2+ pedal pulses. No carotid bruits.  Abdomen: no tenderness, no masses palpated. No hepatosplenomegaly. Bowel sounds positive.  Musculoskeletal: no clubbing / cyanosis. No joint deformity upper and lower extremities. Good ROM, no contractures. Normal muscle tone.  Skin: no rashes, lesions, ulcers. No induration-pale in appearance Neurologic: CN 2-12 grossly intact. Sensation intact, DTR normal. Strength 5/5 x all 4 extremities.  Psychiatric: Normal judgment and insight. Alert and oriented x 3. Normal mood.    Labs on Admission: I have personally reviewed following labs and imaging studies  CBC:  Recent Labs Lab 11/11/16 1219  WBC 15.9*  HGB 12.1  HCT 37.5  MCV 85.0  PLT 413   Basic Metabolic Panel:  Recent Labs Lab 11/11/16 1219  NA 137  K 5.2*  CL 101  CO2 18*  GLUCOSE 123*  BUN 56*  CREATININE 1.50*  CALCIUM 9.8   GFR: CrCl cannot be  calculated (Unknown ideal weight.). Liver Function Tests:  Recent Labs Lab 11/11/16 1219  AST 26  ALT 16  ALKPHOS 322*  BILITOT 1.2  PROT 7.4  ALBUMIN 2.9*    Recent Labs Lab 11/11/16 1219  LIPASE 19   No results for input(s): AMMONIA in the last 168 hours. Coagulation Profile: No results for input(s): INR, PROTIME in the last 168 hours. Cardiac Enzymes: No results for input(s): CKTOTAL, CKMB, CKMBINDEX, TROPONINI in the last 168 hours. BNP (last 3 results) No results for input(s): PROBNP in the last 8760 hours. HbA1C: No results for input(s): HGBA1C in the last 72 hours. CBG: No results for input(s): GLUCAP in the last 168 hours. Lipid Profile: No results for input(s): CHOL, HDL, LDLCALC, TRIG, CHOLHDL, LDLDIRECT in the last 72 hours. Thyroid Function Tests: No results for input(s): TSH, T4TOTAL, FREET4, T3FREE, THYROIDAB in the last 72 hours. Anemia Panel: No results for input(s): VITAMINB12, FOLATE, FERRITIN, TIBC, IRON, RETICCTPCT in the last 72 hours. Urine analysis:    Component Value Date/Time   COLORURINE YELLOW 11/11/2016 [place] 11/11/2016 1528   LABSPEC 1.010 11/11/2016 1528  PHURINE 5.0 11/11/2016 1528   GLUCOSEU NEGATIVE 11/11/2016 1528   HGBUR TRACE (A) 11/11/2016 1528   BILIRUBINUR NEGATIVE 11/11/2016 1528   KETONESUR 15 (A) 11/11/2016 1528   PROTEINUR NEGATIVE 11/11/2016 1528   NITRITE POSITIVE (A) 11/11/2016 1528   LEUKOCYTESUR LARGE (A) 11/11/2016 1528   Sepsis Labs: '@LABRCNTIP'$ (procalcitonin:4,lacticidven:4) )No results found for this or any previous visit (from the past 240 hour(s)).   Radiological Exams on Admission: Ct Abdomen Pelvis W Contrast  Result Date: 11/11/2016 CLINICAL DATA:  83 year old with nausea and vomiting. Symptoms started 2 weeks ago. Abdominal pain. EXAM: CT ABDOMEN AND PELVIS WITH CONTRAST TECHNIQUE: Multidetector CT imaging of the abdomen and pelvis was performed using the standard protocol following  bolus administration of intravenous contrast. CONTRAST:  90m ISOVUE-300 IOPAMIDOL (ISOVUE-300) INJECTION 61% COMPARISON:  None. FINDINGS: Lower chest: 4 mm nodule in the lingula on sequence 5, image 16. Bandlike density in the left lower lobe on sequence 5, image 15 measures 5 mm in width but the length is roughly 21 mm. No large pleural effusions. Hepatobiliary: Innumerable low-density hepatic lesions throughout both lobes of the liver. Index lesion in the right hepatic lobe measuring 5.5 x 3.4 cm on sequence 3, image 18. Second index lesion is in the inferior right hepatic lobe measuring 6.1 x 5.1 cm on image 38. Small amount of perihepatic edema and ascites. Main portal venous system appears to be patent. Liver has a nodular contour. There is mild intrahepatic biliary dilatation in the anterior right hepatic lobe on sequence 3, image 15. Small amount of fluid around the gallbladder. No gross abnormality to the gallbladder. No significant extrahepatic biliary dilatation. Pancreas: Lobulated low-density lesion at the pancreatic tail measuring 3.5 x 2.5 cm on sequence 3, image 18. Findings are suggestive for a primary pancreatic neoplasm. No significant pancreatic duct dilatation. Spleen: Normal appearance of the spleen was minimal perisplenic ascites. Adrenals/Urinary Tract: Normal adrenal glands. Small cyst in the right kidney upper pole. No suspicious renal lesions. Negative for hydronephrosis. Wall thickening along the right side of the urinary bladder. No definite renal or urinary stones. Stomach/Bowel: No significant bowel dilatation. No gross abnormality to the stomach or duodenum. The appendix appears to be normal. Mild asymmetric wall thickening in the ascending colon on sequence 3, image 41 is nonspecific and could be related to incomplete distension. Normal appearance of the terminal ileum. Vascular/Lymphatic: Atherosclerotic calcifications in the aorta and iliac arteries without aortic aneurysm. SMA is  small but appears to be patent. Celiac trunk is patent. IMA appears to be patent. The infrarenal IVC is compressed to due to the enlarged right hepatic lobe. Enlarged lymph node at porta hepatis measuring 1.4 cm in the short axis on sequence 6 image 21. Mild expansion of the left common femoral vein and concern for a filling defect in left common femoral vein. Reproductive: Multiple calcifications in the uterus. No suspicious adnexal lesions. Other: Small amount of free fluid in the pelvis. Negative for free air. Musculoskeletal: Levoscoliosis of the thoracolumbar spine. Severe degenerative disc disease at T12-L1. Severe joint space loss and degenerative changes in the right hip joint. Subtle areas of lucency in the pelvic bones probably related to osteoporosis. IMPRESSION: 3.5 cm pancreatic lesion with innumerable liver lesions. Findings are most compatible with metastatic pancreatic cancer. Liver lesions would be amenable to percutaneous biopsy. Concern for a DVT in the left common femoral vein. Recommend a lower extremity venous duplex examination. Small amount of abdominal ascites. Mild lymphadenopathy in the upper abdomen. Mild wall  thickening in the ascending colon is nonspecific. Neoplastic process in the right colon is thought to be less likely but cannot be completely excluded. Indeterminate nodules and densities at the left lung base. Recommend follow-up chest CT to evaluate for metastatic chest disease. Wall thickening along the right side of the urinary bladder. Findings could be related to cystitis but indeterminate. Consider non emergent Urology consultation. Severe degenerative disease in the right hip. Scoliosis with multilevel degenerative spine disease. These results were called by telephone at the time of interpretation on 11/11/2016 at 2:54 pm to Dr. Carmin Muskrat , who verbally acknowledged these results. Electronically Signed   By: Markus Daft M.D.   On: 11/11/2016 15:05    EKG:  (Independently reviewed) sinus rhythm with ventricular rate 73 bpm, QTC 419 ms, diffuse J-point elevation without any acute ischemic changes  Assessment/Plan Principal Problem:   Acute kidney injury/dehydration/metabolic acidemia  -Patient presents to ER with generalized weakness with acute kidney injury in the setting of intractable nausea and vomiting -Baseline renal function unknown -Current renal function with mildly elevated creatinine and elevated BUN -IV fluid hydration -Follow labs -Hold preadmission ARB  Active Problems:   3.5 cm Pancreatic tail mass/multiple Liver lesions -Patient presents with nausea and vomiting without abdominal pain and CT findings concerning for pancreatic malignancy with metastatic disease -GI consultation although given location of mass likely not a candidate for EUS -IR consultation for possible biopsy of liver lesions versus biopsy of massive pancreatic tail -Alpha-fetoprotein and CA-19-9 -Alk phosphatase elevated but otherwise LFTs normal and does not seem to have an obstructive biliary tree process at this time -Likely will need oncology consultation to determine if appropriate for any modality of treatment even palliative chemotherapy -Currently patient full code, quite independent and was living alone prior to admission    Nausea & vomiting -Secondary to presumed pancreatic malignancy -Clear liquids -Zofran and low-dose Phenergan for nausea    ? DVT (deep venous thrombosis) Common Femoral vein -Full dose/short acting IV heparin infusion -Bilateral lower extremity duplex to confirm presence of DVT    UTI (urinary tract infection) -Abnormal urinalysis secondary explained to severe dehydration but we'll treat his UTI -IV Rocephin -Urine culture    Hypertension -Current blood pressure suboptimal in setting of dehydration and acute kidney injury -Hold preadmission Benicar    High cholesterol -Hold Crestor      DVT prophylaxis: Heparin  infusion Code Status: Full Family Communication: No family bedside at time of my evaluation  Disposition Plan: Home Consults called: GI/Nandigam; IR/Epic order placed for evaluation and management regarding possible biopsy    Oluwatomiwa Kinyon L. ANP-BC [facility] Pager [phone]   If 7PM-7AM, please contact night-coverage [email or web address] Password [email or web address]  11/11/2016, 4:05 PM

## 2016-11-11 NOTE — ED Triage Notes (Signed)
Pt to ED with family for eval of weakness. States she has n/v that started 2 wks ago. Pt states she has been weak and without appetite since. States her last episode of emesis was 3 days ago. Pt lives alone, very independent per family. Pt is alert and oriented. Denies pain. Denies sob. Only complaint is weakness

## 2016-11-11 NOTE — ED Notes (Signed)
Hospitalist at bedside at this time 

## 2016-11-11 NOTE — ED Provider Notes (Signed)
[facility] DEPT Provider Note   CSN: [id number] Arrival date & time: 11/11/16  1200     History   Chief Complaint Chief Complaint  Patient presents with  . Weakness    HPI Alexandra Mcmahon is a 83 y.o. female.  HPI  Patient presents with family members who assist with the history of present illness. They note that over the past 2 weeks the patient has had increasing generalized weakness, and persistent anorexia as well as nausea. Patient acknowledges intermittent abdominal pain, typically worse after eating.  The pain is in the upper abdomen, seemingly. No new syncope, no fever, no confusion, no chest pain, no dyspnea. With postprandial nausea, pain, she has had substantially diminished oral intake over the past 2 weeks.  Past Medical History:  Diagnosis Date  . High cholesterol   . Hypertension     There are no active problems to display for this patient.   No past surgical history on file.  OB History    No data available       Home Medications    Prior to Admission medications   Medication Sig Start Date End Date Taking? Authorizing Provider  amoxicillin (AMOXIL) 500 MG capsule Take 1 capsule (500 mg total) by mouth 3 (three) times daily. 06/17/14   Palumbo, April, MD  Cholecalciferol (VITAMIN D3) 50000 UNITS CAPS Take 1 capsule by mouth once a week. On Sundays.    [provider]  ibuprofen (ADVIL,MOTRIN) 400 MG tablet Take 1 tablet (400 mg total) by mouth every 6 (six) hours as needed. 06/17/14   Palumbo, April, MD  olmesartan (BENICAR) 20 MG tablet Take 20 mg by mouth daily.    [provider]  rosuvastatin (CRESTOR) 20 MG tablet Take 20 mg by mouth at bedtime.    [provider]    Family History Family History  Problem Relation Age of Onset  . Breast cancer Neg Hx     Social History Social History  Substance Use Topics  . Smoking status: Never Smoker  . Smokeless tobacco: Not on file  . Alcohol use No     Allergies     Patient has no known allergies.   Review of Systems Review of Systems  Constitutional:       Per HPI, otherwise negative  HENT:       Per HPI, otherwise negative  Respiratory:       Per HPI, otherwise negative  Cardiovascular:       Per HPI, otherwise negative  Gastrointestinal: Positive for abdominal pain, nausea and vomiting.  Endocrine:       Negative aside from HPI  Genitourinary:       Neg aside from HPI   Musculoskeletal:       Per HPI, otherwise negative  Skin: Positive for pallor.  Neurological: Negative for syncope.     Physical Exam Updated Vital Signs BP 90/65 (BP Location: Left Arm)   Pulse 92   Temp (!) 95.4 F (35.2 C) (Axillary) Comment: unable to get reading orally  Resp 16   SpO2 97%   Physical Exam  Constitutional: She is oriented to person, place, and time. She has a sickly appearance. No distress.  HENT:  Head: Normocephalic and atraumatic.  Eyes: Conjunctivae and EOM are normal.  Cardiovascular: Normal rate and regular rhythm.   Pulmonary/Chest: Effort normal and breath sounds normal. No stridor. No respiratory distress.  Abdominal: She exhibits no distension.    Musculoskeletal: She exhibits no edema.  Neurological: She is  alert and oriented to person, place, and time. She displays atrophy. No cranial nerve deficit.  Poor hearing, otherwise unremarkable  Skin: Skin is warm and dry. There is pallor.  Psychiatric: She has a normal mood and affect.  Nursing note and vitals reviewed.    ED Treatments / Results  Labs (all labs ordered are listed, but only abnormal results are displayed) Labs Reviewed  BASIC METABOLIC PANEL - Abnormal; Notable for the following:       Result Value   Potassium 5.2 (*)    CO2 18 (*)    Glucose, Bld 123 (*)    BUN 56 (*)    Creatinine, Ser 1.50 (*)    GFR calc non Af Amer 32 (*)    GFR calc Af Amer 37 (*)    Anion gap 18 (*)    All other components within normal limits  CBC - Abnormal; Notable for the  following:    WBC 15.9 (*)    RDW 15.9 (*)    All other components within normal limits  URINALYSIS, ROUTINE W REFLEX MICROSCOPIC - Abnormal; Notable for the following:    Hgb urine dipstick TRACE (*)    Ketones, ur 15 (*)    Nitrite POSITIVE (*)    Leukocytes, UA LARGE (*)    All other components within normal limits  HEPATIC FUNCTION PANEL - Abnormal; Notable for the following:    Albumin 2.9 (*)    Alkaline Phosphatase 322 (*)    Indirect Bilirubin 1.0 (*)    All other components within normal limits  URINALYSIS, MICROSCOPIC (REFLEX) - Abnormal; Notable for the following:    Bacteria, UA MANY (*)    Squamous Epithelial / LPF 6-30 (*)    All other components within normal limits  URINE CULTURE  LIPASE, BLOOD  CANCER ANTIGEN 19-9  AFP TUMOR MARKER  CBG MONITORING, ED    EKG  EKG Interpretation  Date/Time:  Sunday November 11 2016 12:53:59 EDT Ventricular Rate:  73 PR Interval:    QRS Duration: 107 QT Interval:  380 QTC Calculation: 419 R Axis:   70 Text Interpretation:  Sinus rhythm Short PR interval Low voltage, extremity leads Borderline ECG Confirmed by Carmin Muskrat [phone]) on 11/11/2016 1:00:18 PM       Radiology Ct Abdomen Pelvis W Contrast  Result Date: 11/11/2016 CLINICAL DATA:  83 year old with nausea and vomiting. Symptoms started 2 weeks ago. Abdominal pain. EXAM: CT ABDOMEN AND PELVIS WITH CONTRAST TECHNIQUE: Multidetector CT imaging of the abdomen and pelvis was performed using the standard protocol following bolus administration of intravenous contrast. CONTRAST:  50mL ISOVUE-300 IOPAMIDOL (ISOVUE-300) INJECTION 61% COMPARISON:  None. FINDINGS: Lower chest: 4 mm nodule in the lingula on sequence 5, image 16. Bandlike density in the left lower lobe on sequence 5, image 15 measures 5 mm in width but the length is roughly 21 mm. No large pleural effusions. Hepatobiliary: Innumerable low-density hepatic lesions throughout both lobes of the liver. Index lesion in  the right hepatic lobe measuring 5.5 x 3.4 cm on sequence 3, image 18. Second index lesion is in the inferior right hepatic lobe measuring 6.1 x 5.1 cm on image 38. Small amount of perihepatic edema and ascites. Main portal venous system appears to be patent. Liver has a nodular contour. There is mild intrahepatic biliary dilatation in the anterior right hepatic lobe on sequence 3, image 15. Small amount of fluid around the gallbladder. No gross abnormality to the gallbladder. No significant extrahepatic biliary dilatation. Pancreas:  Lobulated low-density lesion at the pancreatic tail measuring 3.5 x 2.5 cm on sequence 3, image 18. Findings are suggestive for a primary pancreatic neoplasm. No significant pancreatic duct dilatation. Spleen: Normal appearance of the spleen was minimal perisplenic ascites. Adrenals/Urinary Tract: Normal adrenal glands. Small cyst in the right kidney upper pole. No suspicious renal lesions. Negative for hydronephrosis. Wall thickening along the right side of the urinary bladder. No definite renal or urinary stones. Stomach/Bowel: No significant bowel dilatation. No gross abnormality to the stomach or duodenum. The appendix appears to be normal. Mild asymmetric wall thickening in the ascending colon on sequence 3, image 41 is nonspecific and could be related to incomplete distension. Normal appearance of the terminal ileum. Vascular/Lymphatic: Atherosclerotic calcifications in the aorta and iliac arteries without aortic aneurysm. SMA is small but appears to be patent. Celiac trunk is patent. IMA appears to be patent. The infrarenal IVC is compressed to due to the enlarged right hepatic lobe. Enlarged lymph node at porta hepatis measuring 1.4 cm in the short axis on sequence 6 image 21. Mild expansion of the left common femoral vein and concern for a filling defect in left common femoral vein. Reproductive: Multiple calcifications in the uterus. No suspicious adnexal lesions. Other: Small  amount of free fluid in the pelvis. Negative for free air. Musculoskeletal: Levoscoliosis of the thoracolumbar spine. Severe degenerative disc disease at T12-L1. Severe joint space loss and degenerative changes in the right hip joint. Subtle areas of lucency in the pelvic bones probably related to osteoporosis. IMPRESSION: 3.5 cm pancreatic lesion with innumerable liver lesions. Findings are most compatible with metastatic pancreatic cancer. Liver lesions would be amenable to percutaneous biopsy. Concern for a DVT in the left common femoral vein. Recommend a lower extremity venous duplex examination. Small amount of abdominal ascites. Mild lymphadenopathy in the upper abdomen. Mild wall thickening in the ascending colon is nonspecific. Neoplastic process in the right colon is thought to be less likely but cannot be completely excluded. Indeterminate nodules and densities at the left lung base. Recommend follow-up chest CT to evaluate for metastatic chest disease. Wall thickening along the right side of the urinary bladder. Findings could be related to cystitis but indeterminate. Consider non emergent Urology consultation. Severe degenerative disease in the right hip. Scoliosis with multilevel degenerative spine disease. These results were called by telephone at the time of interpretation on 11/11/2016 at 2:54 pm to Dr. Carmin Muskrat , who verbally acknowledged these results. Electronically Signed   By: Markus Daft M.D.   On: 11/11/2016 15:05    Procedures Procedures (including critical care time)  Medications Ordered in ED Medications  0.9 %  sodium chloride infusion ( Intravenous New Bag/Given 11/11/16 1531)  ondansetron (ZOFRAN) injection 4 mg (not administered)    Or  promethazine (PHENERGAN) injection 6.25 mg (not administered)  cefTRIAXone (ROCEPHIN) 1 g in dextrose 5 % 50 mL IVPB (not administered)  sodium chloride 0.9 % bolus 1,000 mL (0 mLs Intravenous Stopped 11/11/16 1503)  iopamidol (ISOVUE-300)  61 % injection (75 mLs  Contrast Given 11/11/16 1417)  sodium chloride 0.9 % bolus 1,000 mL (1,000 mLs Intravenous New Bag/Given 11/11/16 1531)     Initial Impression / Assessment and Plan / ED Course  I have reviewed the triage vital signs and the nursing notes.  Pertinent labs & imaging results that were available during my care of the patient were reviewed by me and considered in my medical decision making (see chart for details).  Creatinine 1.5.  Patient is receiving fluid resuscitation.   Update: I reviewed the CT imaging myself, and discussed the findings with our radiologist. Concern for malignancy given multiple hepatic lesions, pancreatic abnormality, I discussed this with the patient, and multiple family members. Other findings notable for possible DVT, will require duplex ultrasound, which is pending. Patient also found to have UTI, for which she is receiving antibiotics. Given her persistent weakness, nausea, vomiting, and her new likely malignancy she required admission for further evaluation and management.  Final Clinical Impressions(s) / ED Diagnoses  Pancreatic mass Nausea and vomiting Urinary tract infection Weakness   Carmin Muskrat, MD 11/11/16 [phone]

## 2016-11-11 NOTE — ED Notes (Signed)
Attempted to call report x 1 to [address] [place].  

## 2016-11-11 NOTE — ED Notes (Signed)
Patient transported to CT 

## 2016-11-12 ENCOUNTER — Inpatient Hospital Stay (HOSPITAL_COMMUNITY): Payer: Medicare Other

## 2016-11-12 ENCOUNTER — Encounter (HOSPITAL_COMMUNITY): Payer: Self-pay | Admitting: *Deleted

## 2016-11-12 DIAGNOSIS — R112 Nausea with vomiting, unspecified: Secondary | ICD-10-CM

## 2016-11-12 DIAGNOSIS — I82402 Acute embolism and thrombosis of unspecified deep veins of left lower extremity: Secondary | ICD-10-CM

## 2016-11-12 DIAGNOSIS — E860 Dehydration: Secondary | ICD-10-CM

## 2016-11-12 DIAGNOSIS — I82409 Acute embolism and thrombosis of unspecified deep veins of unspecified lower extremity: Secondary | ICD-10-CM

## 2016-11-12 DIAGNOSIS — N10 Acute tubulo-interstitial nephritis: Secondary | ICD-10-CM

## 2016-11-12 DIAGNOSIS — F502 Bulimia nervosa: Secondary | ICD-10-CM

## 2016-11-12 DIAGNOSIS — R1010 Upper abdominal pain, unspecified: Secondary | ICD-10-CM

## 2016-11-12 DIAGNOSIS — R933 Abnormal findings on diagnostic imaging of other parts of digestive tract: Secondary | ICD-10-CM

## 2016-11-12 LAB — CBC WITH DIFFERENTIAL/PLATELET
Basophils Absolute: 0 10*3/uL (ref 0.0–0.1)
Basophils Relative: 0 %
EOS PCT: 3 %
Eosinophils Absolute: 0.4 10*3/uL (ref 0.0–0.7)
HEMATOCRIT: 28.3 % — AB (ref 36.0–46.0)
Hemoglobin: 9.6 g/dL — ABNORMAL LOW (ref 12.0–15.0)
LYMPHS ABS: 1.2 10*3/uL (ref 0.7–4.0)
LYMPHS PCT: 8 %
MCH: 27.3 pg (ref 26.0–34.0)
MCHC: 33.2 g/dL (ref 30.0–36.0)
MCV: 82.3 fL (ref 78.0–100.0)
MONO ABS: 1.7 10*3/uL — AB (ref 0.1–1.0)
Monocytes Relative: 11 %
NEUTROS ABS: 11.8 10*3/uL — AB (ref 1.7–7.7)
Neutrophils Relative %: 78 %
PLATELETS: 188 10*3/uL (ref 150–400)
RBC: 3.44 MIL/uL — AB (ref 3.87–5.11)
RDW: 15.6 % — ABNORMAL HIGH (ref 11.5–15.5)
WBC: 15.2 10*3/uL — AB (ref 4.0–10.5)

## 2016-11-12 LAB — COMPREHENSIVE METABOLIC PANEL
ALBUMIN: 2.1 g/dL — AB (ref 3.5–5.0)
ALK PHOS: 210 U/L — AB (ref 38–126)
ALT: 12 U/L — ABNORMAL LOW (ref 14–54)
ANION GAP: 10 (ref 5–15)
AST: 17 U/L (ref 15–41)
BUN: 41 mg/dL — ABNORMAL HIGH (ref 6–20)
CALCIUM: 8.4 mg/dL — AB (ref 8.9–10.3)
CO2: 19 mmol/L — AB (ref 22–32)
Chloride: 108 mmol/L (ref 101–111)
Creatinine, Ser: 1.13 mg/dL — ABNORMAL HIGH (ref 0.44–1.00)
GFR calc non Af Amer: 45 mL/min — ABNORMAL LOW (ref >60)
GFR, EST AFRICAN AMERICAN: 52 mL/min — AB (ref >60)
Glucose, Bld: 103 mg/dL — ABNORMAL HIGH (ref 65–99)
POTASSIUM: 4.5 mmol/L (ref 3.5–5.1)
SODIUM: 137 mmol/L (ref 135–145)
Total Bilirubin: 1 mg/dL (ref 0.3–1.2)
Total Protein: 5 g/dL — ABNORMAL LOW (ref 6.5–8.1)

## 2016-11-12 LAB — HEPARIN LEVEL (UNFRACTIONATED): Heparin Unfractionated: 0.67 IU/mL (ref 0.30–0.70)

## 2016-11-12 LAB — PROTIME-INR
INR: 1.32
Prothrombin Time: 16.5 seconds — ABNORMAL HIGH (ref 11.4–15.2)

## 2016-11-12 LAB — CANCER ANTIGEN 19-9: CAN 19-9: 74977 U/mL — AB (ref 0–35)

## 2016-11-12 LAB — AFP TUMOR MARKER: AFP, SERUM, TUMOR MARKER: 5.4 ng/mL (ref 0.0–8.3)

## 2016-11-12 MED ORDER — LATANOPROST 0.005 % OP SOLN
1.0000 [drp] | Freq: Every day | OPHTHALMIC | Status: DC
  Administered 2016-11-12 – 2016-11-16 (×5): 1 [drp] via OPHTHALMIC
  Filled 2016-11-12 (×2): qty 2.5

## 2016-11-12 MED ORDER — FENTANYL CITRATE (PF) 100 MCG/2ML IJ SOLN
INTRAMUSCULAR | Status: AC
  Filled 2016-11-12: qty 2

## 2016-11-12 MED ORDER — TIMOLOL MALEATE 0.5 % OP SOLN
1.0000 [drp] | Freq: Every day | OPHTHALMIC | Status: DC
  Administered 2016-11-12 – 2016-11-16 (×5): 1 [drp] via OPHTHALMIC
  Filled 2016-11-12: qty 5

## 2016-11-12 MED ORDER — HEPARIN (PORCINE) IN NACL 100-0.45 UNIT/ML-% IJ SOLN
800.0000 [IU]/h | INTRAMUSCULAR | Status: DC
  Administered 2016-11-16: 800 [IU]/h via INTRAVENOUS
  Filled 2016-11-12 (×3): qty 250

## 2016-11-12 MED ORDER — ROSUVASTATIN CALCIUM 20 MG PO TABS
20.0000 mg | ORAL_TABLET | Freq: Every day | ORAL | Status: DC
  Administered 2016-11-12 – 2016-11-15 (×4): 20 mg via ORAL
  Filled 2016-11-12 (×4): qty 1

## 2016-11-12 MED ORDER — MIDAZOLAM HCL 2 MG/2ML IJ SOLN
INTRAMUSCULAR | Status: AC | PRN
  Administered 2016-11-12 (×2): 0.5 mg via INTRAVENOUS

## 2016-11-12 MED ORDER — MIDAZOLAM HCL 2 MG/2ML IJ SOLN
INTRAMUSCULAR | Status: AC
  Filled 2016-11-12: qty 2

## 2016-11-12 MED ORDER — VITAMIN D (ERGOCALCIFEROL) 1.25 MG (50000 UNIT) PO CAPS
50000.0000 [IU] | ORAL_CAPSULE | ORAL | Status: DC
  Administered 2016-11-16: 50000 [IU] via ORAL
  Filled 2016-11-12: qty 1

## 2016-11-12 MED ORDER — LIDOCAINE HCL (PF) 1 % IJ SOLN
INTRAMUSCULAR | Status: AC
  Filled 2016-11-12: qty 30

## 2016-11-12 MED ORDER — FENTANYL CITRATE (PF) 100 MCG/2ML IJ SOLN
INTRAMUSCULAR | Status: AC | PRN
  Administered 2016-11-12 (×2): 25 ug via INTRAVENOUS

## 2016-11-12 NOTE — Progress Notes (Signed)
   11/12/16 0900  Clinical Encounter Type  Visited With Patient  Visit Type Initial;Spiritual support  Referral From Nurse  Consult/Referral To Chaplain  Spiritual Encounters  Spiritual Needs Literature;Emotional  Stress Factors  Patient Stress Factors Family relationships;Health changes    Chaplain responded to [place] consult. Patient wishes to wait until son is here to complete AD. Page on call chaplain if/when family arrives and is ready. Provided emotional support and ministry of presence. Elexa Kivi L. Volanda Napoleon, MDiv

## 2016-11-12 NOTE — Consult Note (Signed)
Referring Provider: [facility]   Primary Care Physician:  Vincente Liberty, MD Primary Gastroenterologist:  unassigned  Reason for Consultation: pancreatic lesion on CT scan  ASSESSMENT AND PLAN:     1. 83 yo female with intermittent upper abdominal discomfort, nausea, vomiting and CT scan suggestive of pancreatic tail neoplasm (3.5 x 2.5) with mets to liver.   -May be difficult to sample mass via EUS. IR already called and evaluated patient. Plan is to biopsy one of the liver lesions today.   2. AKI, improving. Cr 1.5 >>> 1.13.   3. Possible DVT left common femoral   4. Mild asymmetric wall thickening of right colon on CTscan, may or may not be of clinical significance. Await liver biopsy results. No bleed in stools. Hgb yesterday 12 but may have have been dry as it it down to 9.6 today.   Agree with Ms. Guenther's assessment and plan. I have seen and evaluated the patient in person.  Await liver bx. Suspect metastatic adenocarcinoma of pancreas.  Gatha Mayer, MD, [facility]    HPI: Alexandra Mcmahon is a 83 y.o. female, relatively healthy, who presented to ED yesterday with weakness, nausea, vomiting. She lives alone with family close by. Hasn't been eating much later, weight loss per son in room. Doesn't have an appetite and gets upper abdominal discomfort sometimes after eating. She has AKI. CT scan shows a pancreatic tail mass with multiple liver lesions. She has a DVT of common femoral vein. Son, daughter-in-law in room and CT scan findings have already been explained to them. .    Past Medical History:  Diagnosis Date  . High cholesterol   . Hypertension     History reviewed. No pertinent surgical history.  Prior to Admission medications   Medication Sig Start Date End Date Taking? Authorizing Provider  olmesartan (BENICAR) 20 MG tablet Take 20 mg by mouth daily.   Yes [provider]  timolol (TIMOPTIC) 0.5 % ophthalmic solution Place 1 drop into both  eyes daily. 10/06/16  Yes [provider]  Travoprost, BAK Free, (TRAVATAN) 0.004 % SOLN ophthalmic solution Place 1 drop into both eyes at bedtime.   Yes [provider]  Vitamin D, Ergocalciferol, (DRISDOL) 50000 units CAPS capsule Take 50,000 Units by mouth every 7 (seven) days.   Yes [provider]  ibuprofen (ADVIL,MOTRIN) 400 MG tablet Take 1 tablet (400 mg total) by mouth every 6 (six) hours as needed. Patient not taking: Reported on 11/11/2016 06/17/14   Palumbo, April, MD  rosuvastatin (CRESTOR) 20 MG tablet Take 20 mg by mouth at bedtime.    [provider]    Current Facility-Administered Medications  Medication Dose Route Frequency Provider Last Rate Last Dose  . 0.9 %  sodium chloride infusion   Intravenous Continuous Samella Parr, NP 75 mL/hr at 11/12/16 0115    . acetaminophen (TYLENOL) tablet 650 mg  650 mg Oral Q6H PRN Samella Parr, NP       Or  . acetaminophen (TYLENOL) suppository 650 mg  650 mg Rectal Q6H PRN Samella Parr, NP      . cefTRIAXone (ROCEPHIN) 1 g in dextrose 5 % 50 mL IVPB  1 g Intravenous Q24H Samella Parr, NP   Stopped at 11/11/16 1646  . heparin ADULT infusion 100 units/mL (25000 units/253mL sodium chloride 0.45%)  800 Units/hr Intravenous Continuous Rolla Flatten, [facility]   Stopped at 11/12/16 4098  . ondansetron (ZOFRAN) injection 4 mg  4 mg  Intravenous Q6H PRN Samella Parr, NP       Or  . promethazine (PHENERGAN) injection 6.25 mg  6.25 mg Intravenous Q6H PRN Samella Parr, NP      . pneumococcal 23 valent vaccine (PNU-IMMUNE) injection 0.5 mL  0.5 mL Intramuscular Tomorrow-1000 Elwin Mocha, MD      . sodium chloride flush (NS) 0.9 % injection 3 mL  3 mL Intravenous Q12H Samella Parr, NP        Allergies as of 11/11/2016  . (No Known Allergies)    Family History  Problem Relation Age of Onset  . Breast cancer Neg Hx     Social History   Social History  . Marital status: Single      Spouse name: N/A  . Number of children: N/A  . Years of education: N/A   Occupational History  . Not on file.   Social History Main Topics  . Smoking status: Never Smoker  . Smokeless tobacco: Not on file  . Alcohol use No  . Drug use: No  . Sexual activity: Not on file   Other Topics Concern  . Not on file   Social History Narrative  . No narrative on file    Review of Systems: All systems reviewed and negative except where noted in HPI.  Physical Exam: Vital signs in last 24 hours: Temp:  [95.4 F (35.2 C)-98.7 F (37.1 C)] 98.5 F (36.9 C) (08/06 0900) Pulse Rate:  [67-104] 88 (08/06 0900) Resp:  [12-19] 16 (08/06 0900) BP: (90-133)/(54-98) 125/84 (08/06 0900) SpO2:  [94 %-100 %] 100 % (08/06 0900) Weight:  [102 lb 8 oz (46.5 kg)-110 lb (49.9 kg)] 104 lb 9.6 oz (47.4 kg) (08/05 2116) Last BM Date: 11/09/16 General:   Alert, pale thin white female in NAD Psych:  Pleasant, cooperative. Normal mood and affect. Eyes:  Pupils equal, sclera clear, no icterus.   Conjunctiva pink. Ears:  Normal auditory acuity. Nose:  No deformity, discharge,  or lesions. Neck:  Supple; no masses Lungs:  Clear throughout to auscultation.   No wheezes, crackles, or rhonchi.  Heart:  Regular rate and rhythm,  Abdomen:  Soft, non-distended, nontender, BS active Rectal:  Deferred  Msk:  Symmetrical without gross deformities. . Pulses:  Normal pulses noted. Neurologic:  Alert and  oriented x4;  grossly normal neurologically. Skin:  Intact without significant lesions or rashes..   Intake/Output from previous day: 08/05 0701 - 08/06 0700 In: 5237.9 [I.V.:3187.9; IV Piggyback:2050] Out: 201 [Urine:201] Intake/Output this shift: Total I/O In: 0  Out: 175 [Urine:175]  Lab Results:  Recent Labs  11/11/16 1219 11/12/16 0136  WBC 15.9* 15.2*  HGB 12.1 9.6*  HCT 37.5 28.3*  PLT 267 188   BMET  Recent Labs  11/11/16 1219 11/12/16 0136  NA 137 137  K 5.2* 4.5  CL 101 108   CO2 18* 19*  GLUCOSE 123* 103*  BUN 56* 41*  CREATININE 1.50* 1.13*  CALCIUM 9.8 8.4*   LFT  Recent Labs  11/11/16 1219 11/12/16 0136  PROT 7.4 5.0*  ALBUMIN 2.9* 2.1*  AST 26 17  ALT 16 12*  ALKPHOS 322* 210*  BILITOT 1.2 1.0  BILIDIR 0.2  --   IBILI 1.0*  --    PT/INR  Recent Labs  11/12/16 0705  LABPROT 16.5*  INR 1.32   Hepatitis Panel No results for input(s): HEPBSAG, HCVAB, HEPAIGM, HEPBIGM in the last 72 hours.   Studies/Results: Ct Abdomen Pelvis W  Contrast  Result Date: 11/11/2016 CLINICAL DATA:  83 year old with nausea and vomiting. Symptoms started 2 weeks ago. Abdominal pain. EXAM: CT ABDOMEN AND PELVIS WITH CONTRAST TECHNIQUE: Multidetector CT imaging of the abdomen and pelvis was performed using the standard protocol following bolus administration of intravenous contrast. CONTRAST:  51mL ISOVUE-300 IOPAMIDOL (ISOVUE-300) INJECTION 61% COMPARISON:  None. FINDINGS: Lower chest: 4 mm nodule in the lingula on sequence 5, image 16. Bandlike density in the left lower lobe on sequence 5, image 15 measures 5 mm in width but the length is roughly 21 mm. No large pleural effusions. Hepatobiliary: Innumerable low-density hepatic lesions throughout both lobes of the liver. Index lesion in the right hepatic lobe measuring 5.5 x 3.4 cm on sequence 3, image 18. Second index lesion is in the inferior right hepatic lobe measuring 6.1 x 5.1 cm on image 38. Small amount of perihepatic edema and ascites. Main portal venous system appears to be patent. Liver has a nodular contour. There is mild intrahepatic biliary dilatation in the anterior right hepatic lobe on sequence 3, image 15. Small amount of fluid around the gallbladder. No gross abnormality to the gallbladder. No significant extrahepatic biliary dilatation. Pancreas: Lobulated low-density lesion at the pancreatic tail measuring 3.5 x 2.5 cm on sequence 3, image 18. Findings are suggestive for a primary pancreatic neoplasm. No  significant pancreatic duct dilatation. Spleen: Normal appearance of the spleen was minimal perisplenic ascites. Adrenals/Urinary Tract: Normal adrenal glands. Small cyst in the right kidney upper pole. No suspicious renal lesions. Negative for hydronephrosis. Wall thickening along the right side of the urinary bladder. No definite renal or urinary stones. Stomach/Bowel: No significant bowel dilatation. No gross abnormality to the stomach or duodenum. The appendix appears to be normal. Mild asymmetric wall thickening in the ascending colon on sequence 3, image 41 is nonspecific and could be related to incomplete distension. Normal appearance of the terminal ileum. Vascular/Lymphatic: Atherosclerotic calcifications in the aorta and iliac arteries without aortic aneurysm. SMA is small but appears to be patent. Celiac trunk is patent. IMA appears to be patent. The infrarenal IVC is compressed to due to the enlarged right hepatic lobe. Enlarged lymph node at porta hepatis measuring 1.4 cm in the short axis on sequence 6 image 21. Mild expansion of the left common femoral vein and concern for a filling defect in left common femoral vein. Reproductive: Multiple calcifications in the uterus. No suspicious adnexal lesions. Other: Small amount of free fluid in the pelvis. Negative for free air. Musculoskeletal: Levoscoliosis of the thoracolumbar spine. Severe degenerative disc disease at T12-L1. Severe joint space loss and degenerative changes in the right hip joint. Subtle areas of lucency in the pelvic bones probably related to osteoporosis. IMPRESSION: 3.5 cm pancreatic lesion with innumerable liver lesions. Findings are most compatible with metastatic pancreatic cancer. Liver lesions would be amenable to percutaneous biopsy. Concern for a DVT in the left common femoral vein. Recommend a lower extremity venous duplex examination. Small amount of abdominal ascites. Mild lymphadenopathy in the upper abdomen. Mild wall  thickening in the ascending colon is nonspecific. Neoplastic process in the right colon is thought to be less likely but cannot be completely excluded. Indeterminate nodules and densities at the left lung base. Recommend follow-up chest CT to evaluate for metastatic chest disease. Wall thickening along the right side of the urinary bladder. Findings could be related to cystitis but indeterminate. Consider non emergent Urology consultation. Severe degenerative disease in the right hip. Scoliosis with multilevel degenerative spine disease.  These results were called by telephone at the time of interpretation on 11/11/2016 at 2:54 pm to Dr. Carmin Muskrat , who verbally acknowledged these results. Electronically Signed   By: Markus Daft M.D.   On: 11/11/2016 15:05     Tye Savoy, NP-C @  11/12/2016, 10:21 AM  Pager number [phone]

## 2016-11-12 NOTE — Sedation Documentation (Signed)
Patient is resting comfortably. 

## 2016-11-12 NOTE — Sedation Documentation (Signed)
Bandaid R flank intact  

## 2016-11-12 NOTE — Progress Notes (Signed)
ANTICOAGULATION CONSULT NOTE   Pharmacy Consult for Heparin Indication: DVT  No Known Allergies  Patient Measurements: Height: 5\' 2"  (157.5 cm) Weight: 104 lb 9.6 oz (47.4 kg) IBW/kg (Calculated) : 50.1  Heparin Dosing Weight: 50 kg  Vital Signs: Temp: 97.6 F (36.4 C) (08/05 2116) Temp Source: Oral (08/05 2116) BP: 133/65 (08/05 2116) Pulse Rate: 69 (08/05 2116)  Labs:  Recent Labs  11/11/16 1219 11/12/16 0136  HGB 12.1 9.6*  HCT 37.5 28.3*  PLT 267 188  HEPARINUNFRC  --  0.67  CREATININE 1.50* 1.13*    Estimated Creatinine Clearance: 29.7 mL/min (A) (by C-G formula based on SCr of 1.13 mg/dL (H)).   Medical History: Past Medical History:  Diagnosis Date  . High cholesterol   . Hypertension     Assessment: 30 YOF who presented on 8/5 with complaints of general weakness, nausea, and decreased appetite for several weeks. The patient was also found to have a DVT and pharmacy was consulted to start heparin for anticoagulation. Initial heparin level is therapeutic.   Goal of Therapy:  Heparin level 0.3-0.7 units/ml Monitor platelets by anticoagulation protocol: Yes   Plan:  -Cont heparin at 800 units/hr -1000 HL -Watch Hgb  Narda Bonds, PharmD, BCPS Clinical Pharmacist Phone: [phone]

## 2016-11-12 NOTE — Procedures (Signed)
  Procedure:   US liver lesion core biopsy 18g x3 Preprocedure diagnosis:  Liver mets Postprocedure diagnosis:  same EBL:     minimal Complications:   none immediate  See full dictation in [facility].  Dillard Cannon MD Main # [phone] Pager  [phone]

## 2016-11-12 NOTE — Plan of Care (Signed)
Problem: Activity: Goal: Risk for activity intolerance will decrease Outcome: Progressing Patient able to walk with walker. Will ambulate patient today

## 2016-11-12 NOTE — Consult Note (Signed)
Chief Complaint: Patient was seen in consultation today for  Liver lesion biopsy Chief Complaint  Patient presents with  . Weakness   at the request of Dr Derrek Gu  Referring Physician(s): Dr Derrek Gu  Supervising Physician: Arne Cleveland  Patient Status: [facility] - In-pt  History of Present Illness: Alexandra Mcmahon is a 83 y.o. female   N/V; weight loss sxs x 2 weeks Denies abd pain CT yesterday: IMPRESSION: 3.5 cm pancreatic lesion with innumerable liver lesions. Findings are most compatible with metastatic pancreatic cancer. Liver lesions would be amenable to percutaneous biopsy. Concern for a DVT in the left common femoral vein. Recommend a lower extremity venous duplex examination. Small amount of abdominal ascites. Mild lymphadenopathy in the upper abdomen. Mild wall thickening in the ascending colon is nonspecific. Neoplastic process in the right colon is thought to be less likely but cannot be completely excluded. Indeterminate nodules and densities at the left lung base. Recommend follow-up chest CT to evaluate for metastatic chest disease. Wall thickening along the right side of the urinary bladder. Findings could be related to cystitis but indeterminate. Consider non emergent Urology consultation. Severe degenerative disease in the right hip. Scoliosis with multilevel degenerative spine disease.  Request made for biopsy of pancreatic mass vs liver lesions per Dr Allyson Sabal Dr Vernard Gambles has reviewed imaging and approves procedure  IV Heparin stopped at 9 am today. LE Doppler in process   Past Medical History:  Diagnosis Date  . High cholesterol   . Hypertension     History reviewed. No pertinent surgical history.  Allergies: Patient has no known allergies.  Medications: Prior to Admission medications   Medication Sig Start Date End Date Taking? Authorizing Provider  olmesartan (BENICAR) 20 MG tablet Take 20 mg by mouth daily.   Yes [provider]    timolol (TIMOPTIC) 0.5 % ophthalmic solution Place 1 drop into both eyes daily. 10/06/16  Yes [provider]  Travoprost, BAK Free, (TRAVATAN) 0.004 % SOLN ophthalmic solution Place 1 drop into both eyes at bedtime.   Yes [provider]  Vitamin D, Ergocalciferol, (DRISDOL) 50000 units CAPS capsule Take 50,000 Units by mouth every 7 (seven) days.   Yes [provider]  ibuprofen (ADVIL,MOTRIN) 400 MG tablet Take 1 tablet (400 mg total) by mouth every 6 (six) hours as needed. Patient not taking: Reported on 11/11/2016 06/17/14   Palumbo, April, MD  rosuvastatin (CRESTOR) 20 MG tablet Take 20 mg by mouth at bedtime.    [provider]     Family History  Problem Relation Age of Onset  . Breast cancer Neg Hx     Social History   Social History  . Marital status: Single    Spouse name: N/A  . Number of children: N/A  . Years of education: N/A   Social History Main Topics  . Smoking status: Never Smoker  . Smokeless tobacco: None  . Alcohol use No  . Drug use: No  . Sexual activity: Not Asked   Other Topics Concern  . None   Social History Narrative  . None    Review of Systems: A 12 point ROS discussed and pertinent positives are indicated in the HPI above.  All other systems are negative.  Review of Systems  Constitutional: Positive for activity change, appetite change, fatigue and unexpected weight change. Negative for fever.  Respiratory: Negative for shortness of breath.   Cardiovascular: Negative for chest pain.  Gastrointestinal: Negative for abdominal distention and  abdominal pain.  Neurological: Positive for weakness.  Psychiatric/Behavioral: Negative for behavioral problems and confusion.    Vital Signs: BP (!) 113/54 (BP Location: Left Arm)   Pulse 67   Temp 98.7 F (37.1 C) (Rectal)   Resp 16   Ht 5\' 2"  (1.575 m)   Wt 104 lb 9.6 oz (47.4 kg)   SpO2 100%   BMI 19.13 kg/m   Physical Exam  Constitutional: She is  oriented to person, place, and time.  Cardiovascular: Normal rate and regular rhythm.   Pulmonary/Chest: Effort normal and breath sounds normal.  Abdominal: Soft. Bowel sounds are normal. There is no tenderness.  Musculoskeletal: Normal range of motion.  Neurological: She is alert and oriented to person, place, and time.  Skin: Skin is dry.  Psychiatric: She has a normal mood and affect. Her behavior is normal. Thought content normal.  Nursing note and vitals reviewed.   Mallampati Score:  MD Evaluation Airway: WNL Heart: WNL Abdomen: WNL Chest/ Lungs: WNL ASA  Classification: 3 Mallampati/Airway Score: Two  Imaging: Ct Abdomen Pelvis W Contrast  Result Date: 11/11/2016 CLINICAL DATA:  83 year old with nausea and vomiting. Symptoms started 2 weeks ago. Abdominal pain. EXAM: CT ABDOMEN AND PELVIS WITH CONTRAST TECHNIQUE: Multidetector CT imaging of the abdomen and pelvis was performed using the standard protocol following bolus administration of intravenous contrast. CONTRAST:  107mL ISOVUE-300 IOPAMIDOL (ISOVUE-300) INJECTION 61% COMPARISON:  None. FINDINGS: Lower chest: 4 mm nodule in the lingula on sequence 5, image 16. Bandlike density in the left lower lobe on sequence 5, image 15 measures 5 mm in width but the length is roughly 21 mm. No large pleural effusions. Hepatobiliary: Innumerable low-density hepatic lesions throughout both lobes of the liver. Index lesion in the right hepatic lobe measuring 5.5 x 3.4 cm on sequence 3, image 18. Second index lesion is in the inferior right hepatic lobe measuring 6.1 x 5.1 cm on image 38. Small amount of perihepatic edema and ascites. Main portal venous system appears to be patent. Liver has a nodular contour. There is mild intrahepatic biliary dilatation in the anterior right hepatic lobe on sequence 3, image 15. Small amount of fluid around the gallbladder. No gross abnormality to the gallbladder. No significant extrahepatic biliary dilatation.  Pancreas: Lobulated low-density lesion at the pancreatic tail measuring 3.5 x 2.5 cm on sequence 3, image 18. Findings are suggestive for a primary pancreatic neoplasm. No significant pancreatic duct dilatation. Spleen: Normal appearance of the spleen was minimal perisplenic ascites. Adrenals/Urinary Tract: Normal adrenal glands. Small cyst in the right kidney upper pole. No suspicious renal lesions. Negative for hydronephrosis. Wall thickening along the right side of the urinary bladder. No definite renal or urinary stones. Stomach/Bowel: No significant bowel dilatation. No gross abnormality to the stomach or duodenum. The appendix appears to be normal. Mild asymmetric wall thickening in the ascending colon on sequence 3, image 41 is nonspecific and could be related to incomplete distension. Normal appearance of the terminal ileum. Vascular/Lymphatic: Atherosclerotic calcifications in the aorta and iliac arteries without aortic aneurysm. SMA is small but appears to be patent. Celiac trunk is patent. IMA appears to be patent. The infrarenal IVC is compressed to due to the enlarged right hepatic lobe. Enlarged lymph node at porta hepatis measuring 1.4 cm in the short axis on sequence 6 image 21. Mild expansion of the left common femoral vein and concern for a filling defect in left common femoral vein. Reproductive: Multiple calcifications in the uterus. No suspicious adnexal lesions.  Other: Small amount of free fluid in the pelvis. Negative for free air. Musculoskeletal: Levoscoliosis of the thoracolumbar spine. Severe degenerative disc disease at T12-L1. Severe joint space loss and degenerative changes in the right hip joint. Subtle areas of lucency in the pelvic bones probably related to osteoporosis. IMPRESSION: 3.5 cm pancreatic lesion with innumerable liver lesions. Findings are most compatible with metastatic pancreatic cancer. Liver lesions would be amenable to percutaneous biopsy. Concern for a DVT in the  left common femoral vein. Recommend a lower extremity venous duplex examination. Small amount of abdominal ascites. Mild lymphadenopathy in the upper abdomen. Mild wall thickening in the ascending colon is nonspecific. Neoplastic process in the right colon is thought to be less likely but cannot be completely excluded. Indeterminate nodules and densities at the left lung base. Recommend follow-up chest CT to evaluate for metastatic chest disease. Wall thickening along the right side of the urinary bladder. Findings could be related to cystitis but indeterminate. Consider non emergent Urology consultation. Severe degenerative disease in the right hip. Scoliosis with multilevel degenerative spine disease. These results were called by telephone at the time of interpretation on 11/11/2016 at 2:54 pm to Dr. Carmin Muskrat , who verbally acknowledged these results. Electronically Signed   By: Markus Daft M.D.   On: 11/11/2016 15:05    Labs:  CBC:  Recent Labs  11/11/16 1219 11/12/16 0136  WBC 15.9* 15.2*  HGB 12.1 9.6*  HCT 37.5 28.3*  PLT 267 188    COAGS:  Recent Labs  11/12/16 0705  INR 1.32    BMP:  Recent Labs  11/11/16 1219 11/12/16 0136  NA 137 137  K 5.2* 4.5  CL 101 108  CO2 18* 19*  GLUCOSE 123* 103*  BUN 56* 41*  CALCIUM 9.8 8.4*  CREATININE 1.50* 1.13*  GFRNONAA 32* 45*  GFRAA 37* 52*    LIVER FUNCTION TESTS:  Recent Labs  11/11/16 1219 11/12/16 0136  BILITOT 1.2 1.0  AST 26 17  ALT 16 12*  ALKPHOS 322* 210*  PROT 7.4 5.0*  ALBUMIN 2.9* 2.1*    TUMOR MARKERS: No results for input(s): AFPTM, CEA, CA199, CHROMGRNA in the last 8760 hours.  Assessment and Plan:  Wt loss; N/V Pancreatic mass and liver lesions Scheduled of liver lesions/core biopsy on IR today Hep off at 9am Risks and benefits discussed with the patient including, but not limited to bleeding, infection, damage to adjacent structures or low yield requiring additional tests. All of the  patient's questions were answered, patient is agreeable to proceed. Consent signed and in chart.    Thank you for this interesting consult.  I greatly enjoyed meeting Alexandra Mcmahon and look forward to participating in their care.  A copy of this report was sent to the requesting provider on this date.  Electronically Signed: Lavonia Drafts, PA-C 11/12/2016, 9:27 AM   I spent a total of 40 Minutes    in face to face in clinical consultation, greater than 50% of which was counseling/coordinating care for liver lesion biopsy

## 2016-11-12 NOTE — Progress Notes (Signed)
ANTICOAGULATION CONSULT NOTE   Pharmacy Consult for Heparin Indication: DVT  No Known Allergies  Patient Measurements: Height: 5\' 2"  (157.5 cm) Weight: 104 lb 9.6 oz (47.4 kg) IBW/kg (Calculated) : 50.1  Heparin Dosing Weight: 50 kg  Vital Signs: Temp: 98.5 F (36.9 C) (08/06 0900) Temp Source: Oral (08/06 0900) BP: 118/64 (08/06 1633) Pulse Rate: 80 (08/06 1633)  Labs:  Recent Labs  11/11/16 1219 11/12/16 0136 11/12/16 0705  HGB 12.1 9.6*  --   HCT 37.5 28.3*  --   PLT 267 188  --   LABPROT  --   --  16.5*  INR  --   --  1.32  HEPARINUNFRC  --  0.67  --   CREATININE 1.50* 1.13*  --     Estimated Creatinine Clearance: 29.7 mL/min (A) (by C-G formula based on SCr of 1.13 mg/dL (H)).   Medical History: Past Medical History:  Diagnosis Date  . High cholesterol   . Hypertension     Assessment: 25 YOF on IV heparin for new LLE DVT, She is s/p liver biopsy this afternoon. Will resume heparin 6 hrs after procedure at ~ 2200. Dr. Vernard Gambles (radiology) agreed with the plan.   Goal of Therapy:  Heparin level 0.3-0.7 units/ml Monitor platelets by anticoagulation protocol: Yes   Plan:  -Restart heparin 750 units/hr at 2200 tonight -f/u heparin level at 0600 tomorrow AM  Maryanna Shape, PharmD, BCPS  Clinical Pharmacist  Pager: [phone]

## 2016-11-12 NOTE — Progress Notes (Addendum)
[facility] PROGRESS NOTE  Alexandra Mcmahon BRA:[id number] DOB: [birth year] DOA: 11/11/2016   PCP: Vincente Liberty, MD     Assessment/Plan: Principal Problem:   Acute kidney injury [facility]) Active Problems:   Hypertension   High cholesterol   Dehydration   Nausea & vomiting   Pancreatic mass   Liver lesions   ? DVT (deep venous thrombosis) Common Femoral vein   UTI (urinary tract infection)    83 y.o. female with medical history significant for hypertension and dyslipidemia who presented to the ER complaining of generalized weakness after expressing nausea and vomiting for 2 weeks with last emesis episode 3 days ago.  Unfortunately CT imaging of the abdomen and pelvis did reveal a 3.5 cm pancreatic tail lesion as well as multiple lesions on the liver concerning for metastatic disease. There also was question of a DVT involving the left common femoral vein. Patient and family updated on CT findings by EDP. Patient will be admitted for further evaluation and treatment.   Assessment and plan  Acute kidney injury/dehydration/metabolic acidemia  Creatinine improved from 1.5-1.13 -Patient presents to ER with generalized weakness with acute kidney injury in the setting of intractable nausea and vomiting Continue gentle hydration -Current renal function with mildly elevated creatinine and elevated BUN -IV fluid hydration -Follow labs -Hold preadmission ARB      3.5 cm Pancreatic tail mass/multiple Liver lesions  CT findings concerning for pancreatic malignancy with metastatic disease -GI consultation although given location of mass likely not a candidate for EUS -IR consultation for possible biopsy of liver lesions versus biopsy of massive pancreatic tail -Alpha-fetoprotein and CA-19-9 pending -Alk phosphatase elevated but otherwise LFTs normal and does not seem to have an obstructive biliary tree process at this time -Likely will need oncology consultation once biopsy  results are available, to determine if appropriate for any modality of treatment even palliative chemotherapy -Currently patient full code, quite independent and was living alone prior to admission     Nausea & vomiting -Secondary to presumed pancreatic malignancy -Clear liquids -Zofran and low-dose Phenergan for nausea    ? DVT (deep venous thrombosis) Common Femoral vein  Isolated DVT noted in the left common femoral vein, continue heparin      UTI (urinary tract infection) -Abnormal urinalysis secondary explained to severe dehydration but we'll treat his UTI -IV Rocephin -Urine culture, blood culture pending    Hypertension -Current blood pressure suboptimal in setting of dehydration and acute kidney injury -Hold preadmission Benicar    High cholesterol -Hold Crestor     DVT prophylaxsis   Code Status:      Family Communication: Discussed in detail with the patient's daughter in law , all imaging results, lab results explained to the patient   Disposition Plan Pending improvement, diagnosis     Consultants:  Interventional radiology   Procedures:  Ultrasound-guided biopsy   Antibiotics: Anti-infectives    Start     Dose/Rate Route Frequency Ordered Stop   11/11/16 1630  cefTRIAXone (ROCEPHIN) 1 g in dextrose 5 % 50 mL IVPB     1 g 100 mL/hr over 30 Minutes Intravenous Every 24 hours 11/11/16 1600           HPI/Subjective: Denies nausea, vomitting , no appetite   Objective: Vitals:   11/11/16 2116 11/12/16 0316 11/12/16 0510 11/12/16 0900  BP: 133/65  (!) 113/54 125/84  Pulse: 69  67 88  Resp:   16 16  Temp: 97.6 F (36.4 C)  98.7 F (37.1 C)  98.5 F (36.9 C)  TempSrc: Oral Rectal  Oral  SpO2: 98%  100% 100%  Weight: 47.4 kg (104 lb 9.6 oz)     Height:        Intake/Output Summary (Last 24 hours) at 11/12/16 1143 Last data filed at 11/12/16 1121  Gross per 24 hour  Intake          5240.85 ml  Output              376 ml  Net           4864.85 ml    Exam:  Examination:  General exam: Appears calm and comfortable  Respiratory system: Clear to auscultation. Respiratory effort normal. Cardiovascular system: S1 & S2 heard, RRR. No JVD, murmurs, rubs, gallops or clicks. No pedal edema. Gastrointestinal system: Abdomen is nondistended, soft and nontender. No organomegaly or masses felt. Normal bowel sounds heard. Central nervous system: Alert and oriented. No focal neurological deficits. Extremities: Symmetric 5 x 5 power. Skin: No rashes, lesions or ulcers Psychiatry: Judgement and insight appear normal. Mood & affect appropriate.     Data Reviewed: I have personally reviewed following labs and imaging studies  Micro Results Recent Results (from the past 240 hour(s))  Culture, blood (Routine X 2) w Reflex to ID Panel     Status: None (Preliminary result)   Collection Time: 11/11/16  6:19 PM  Result Value Ref Range Status   Specimen Description BLOOD RIGHT HAND  Final   Special Requests   Final    BOTTLES DRAWN AEROBIC ONLY Blood Culture adequate volume   Culture NO GROWTH < 24 HOURS  Final   Report Status PENDING  Incomplete  Culture, blood (Routine X 2) w Reflex to ID Panel     Status: None (Preliminary result)   Collection Time: 11/11/16  6:26 PM  Result Value Ref Range Status   Specimen Description BLOOD LEFT HAND  Final   Special Requests IN PEDIATRIC BOTTLE Blood Culture adequate volume  Final   Culture NO GROWTH < 24 HOURS  Final   Report Status PENDING  Incomplete    Radiology Reports Ct Abdomen Pelvis W Contrast  Result Date: 11/11/2016 CLINICAL DATA:  83 year old with nausea and vomiting. Symptoms started 2 weeks ago. Abdominal pain. EXAM: CT ABDOMEN AND PELVIS WITH CONTRAST TECHNIQUE: Multidetector CT imaging of the abdomen and pelvis was performed using the standard protocol following bolus administration of intravenous contrast. CONTRAST:  9m ISOVUE-300 IOPAMIDOL (ISOVUE-300) INJECTION 61%  COMPARISON:  None. FINDINGS: Lower chest: 4 mm nodule in the lingula on sequence 5, image 16. Bandlike density in the left lower lobe on sequence 5, image 15 measures 5 mm in width but the length is roughly 21 mm. No large pleural effusions. Hepatobiliary: Innumerable low-density hepatic lesions throughout both lobes of the liver. Index lesion in the right hepatic lobe measuring 5.5 x 3.4 cm on sequence 3, image 18. Second index lesion is in the inferior right hepatic lobe measuring 6.1 x 5.1 cm on image 38. Small amount of perihepatic edema and ascites. Main portal venous system appears to be patent. Liver has a nodular contour. There is mild intrahepatic biliary dilatation in the anterior right hepatic lobe on sequence 3, image 15. Small amount of fluid around the gallbladder. No gross abnormality to the gallbladder. No significant extrahepatic biliary dilatation. Pancreas: Lobulated low-density lesion at the pancreatic tail measuring 3.5 x 2.5 cm on sequence 3, image 18. Findings are suggestive for  a primary pancreatic neoplasm. No significant pancreatic duct dilatation. Spleen: Normal appearance of the spleen was minimal perisplenic ascites. Adrenals/Urinary Tract: Normal adrenal glands. Small cyst in the right kidney upper pole. No suspicious renal lesions. Negative for hydronephrosis. Wall thickening along the right side of the urinary bladder. No definite renal or urinary stones. Stomach/Bowel: No significant bowel dilatation. No gross abnormality to the stomach or duodenum. The appendix appears to be normal. Mild asymmetric wall thickening in the ascending colon on sequence 3, image 41 is nonspecific and could be related to incomplete distension. Normal appearance of the terminal ileum. Vascular/Lymphatic: Atherosclerotic calcifications in the aorta and iliac arteries without aortic aneurysm. SMA is small but appears to be patent. Celiac trunk is patent. IMA appears to be patent. The infrarenal IVC is  compressed to due to the enlarged right hepatic lobe. Enlarged lymph node at porta hepatis measuring 1.4 cm in the short axis on sequence 6 image 21. Mild expansion of the left common femoral vein and concern for a filling defect in left common femoral vein. Reproductive: Multiple calcifications in the uterus. No suspicious adnexal lesions. Other: Small amount of free fluid in the pelvis. Negative for free air. Musculoskeletal: Levoscoliosis of the thoracolumbar spine. Severe degenerative disc disease at T12-L1. Severe joint space loss and degenerative changes in the right hip joint. Subtle areas of lucency in the pelvic bones probably related to osteoporosis. IMPRESSION: 3.5 cm pancreatic lesion with innumerable liver lesions. Findings are most compatible with metastatic pancreatic cancer. Liver lesions would be amenable to percutaneous biopsy. Concern for a DVT in the left common femoral vein. Recommend a lower extremity venous duplex examination. Small amount of abdominal ascites. Mild lymphadenopathy in the upper abdomen. Mild wall thickening in the ascending colon is nonspecific. Neoplastic process in the right colon is thought to be less likely but cannot be completely excluded. Indeterminate nodules and densities at the left lung base. Recommend follow-up chest CT to evaluate for metastatic chest disease. Wall thickening along the right side of the urinary bladder. Findings could be related to cystitis but indeterminate. Consider non emergent Urology consultation. Severe degenerative disease in the right hip. Scoliosis with multilevel degenerative spine disease. These results were called by telephone at the time of interpretation on 11/11/2016 at 2:54 pm to Dr. Carmin Muskrat , who verbally acknowledged these results. Electronically Signed   By: Markus Daft M.D.   On: 11/11/2016 15:05     CBC  Recent Labs Lab 11/11/16 1219 11/12/16 0136  WBC 15.9* 15.2*  HGB 12.1 9.6*  HCT 37.5 28.3*  PLT 267 188   MCV 85.0 82.3  MCH 27.4 27.3  MCHC 32.3 33.2  RDW 15.9* 15.6*  LYMPHSABS  --  1.2  MONOABS  --  1.7*  EOSABS  --  0.4  BASOSABS  --  0.0    Chemistries   Recent Labs Lab 11/11/16 1219 11/11/16 1908 11/12/16 0136  NA 137  --  137  K 5.2*  --  4.5  CL 101  --  108  CO2 18*  --  19*  GLUCOSE 123*  --  103*  BUN 56*  --  41*  CREATININE 1.50*  --  1.13*  CALCIUM 9.8  --  8.4*  MG  --  2.3  --   AST 26  --  17  ALT 16  --  12*  ALKPHOS 322*  --  210*  BILITOT 1.2  --  1.0   ------------------------------------------------------------------------------------------------------------------ estimated creatinine clearance is  29.7 mL/min (A) (by C-G formula based on SCr of 1.13 mg/dL (H)). ------------------------------------------------------------------------------------------------------------------ No results for input(s): HGBA1C in the last 72 hours. ------------------------------------------------------------------------------------------------------------------ No results for input(s): CHOL, HDL, LDLCALC, TRIG, CHOLHDL, LDLDIRECT in the last 72 hours. ------------------------------------------------------------------------------------------------------------------ No results for input(s): TSH, T4TOTAL, T3FREE, THYROIDAB in the last 72 hours.  Invalid input(s): FREET3 ------------------------------------------------------------------------------------------------------------------ No results for input(s): VITAMINB12, FOLATE, FERRITIN, TIBC, IRON, RETICCTPCT in the last 72 hours.  Coagulation profile  Recent Labs Lab 11/12/16 0705  INR 1.32    No results for input(s): DDIMER in the last 72 hours.  Cardiac Enzymes No results for input(s): CKMB, TROPONINI, MYOGLOBIN in the last 168 hours.  Invalid input(s): CK ------------------------------------------------------------------------------------------------------------------ Invalid input(s): POCBNP   CBG: No  results for input(s): GLUCAP in the last 168 hours.     Studies: Ct Abdomen Pelvis W Contrast  Result Date: 11/11/2016 CLINICAL DATA:  83 year old with nausea and vomiting. Symptoms started 2 weeks ago. Abdominal pain. EXAM: CT ABDOMEN AND PELVIS WITH CONTRAST TECHNIQUE: Multidetector CT imaging of the abdomen and pelvis was performed using the standard protocol following bolus administration of intravenous contrast. CONTRAST:  84m ISOVUE-300 IOPAMIDOL (ISOVUE-300) INJECTION 61% COMPARISON:  None. FINDINGS: Lower chest: 4 mm nodule in the lingula on sequence 5, image 16. Bandlike density in the left lower lobe on sequence 5, image 15 measures 5 mm in width but the length is roughly 21 mm. No large pleural effusions. Hepatobiliary: Innumerable low-density hepatic lesions throughout both lobes of the liver. Index lesion in the right hepatic lobe measuring 5.5 x 3.4 cm on sequence 3, image 18. Second index lesion is in the inferior right hepatic lobe measuring 6.1 x 5.1 cm on image 38. Small amount of perihepatic edema and ascites. Main portal venous system appears to be patent. Liver has a nodular contour. There is mild intrahepatic biliary dilatation in the anterior right hepatic lobe on sequence 3, image 15. Small amount of fluid around the gallbladder. No gross abnormality to the gallbladder. No significant extrahepatic biliary dilatation. Pancreas: Lobulated low-density lesion at the pancreatic tail measuring 3.5 x 2.5 cm on sequence 3, image 18. Findings are suggestive for a primary pancreatic neoplasm. No significant pancreatic duct dilatation. Spleen: Normal appearance of the spleen was minimal perisplenic ascites. Adrenals/Urinary Tract: Normal adrenal glands. Small cyst in the right kidney upper pole. No suspicious renal lesions. Negative for hydronephrosis. Wall thickening along the right side of the urinary bladder. No definite renal or urinary stones. Stomach/Bowel: No significant bowel dilatation.  No gross abnormality to the stomach or duodenum. The appendix appears to be normal. Mild asymmetric wall thickening in the ascending colon on sequence 3, image 41 is nonspecific and could be related to incomplete distension. Normal appearance of the terminal ileum. Vascular/Lymphatic: Atherosclerotic calcifications in the aorta and iliac arteries without aortic aneurysm. SMA is small but appears to be patent. Celiac trunk is patent. IMA appears to be patent. The infrarenal IVC is compressed to due to the enlarged right hepatic lobe. Enlarged lymph node at porta hepatis measuring 1.4 cm in the short axis on sequence 6 image 21. Mild expansion of the left common femoral vein and concern for a filling defect in left common femoral vein. Reproductive: Multiple calcifications in the uterus. No suspicious adnexal lesions. Other: Small amount of free fluid in the pelvis. Negative for free air. Musculoskeletal: Levoscoliosis of the thoracolumbar spine. Severe degenerative disc disease at T12-L1. Severe joint space loss and degenerative changes in the right hip joint. Subtle areas  of lucency in the pelvic bones probably related to osteoporosis. IMPRESSION: 3.5 cm pancreatic lesion with innumerable liver lesions. Findings are most compatible with metastatic pancreatic cancer. Liver lesions would be amenable to percutaneous biopsy. Concern for a DVT in the left common femoral vein. Recommend a lower extremity venous duplex examination. Small amount of abdominal ascites. Mild lymphadenopathy in the upper abdomen. Mild wall thickening in the ascending colon is nonspecific. Neoplastic process in the right colon is thought to be less likely but cannot be completely excluded. Indeterminate nodules and densities at the left lung base. Recommend follow-up chest CT to evaluate for metastatic chest disease. Wall thickening along the right side of the urinary bladder. Findings could be related to cystitis but indeterminate. Consider non  emergent Urology consultation. Severe degenerative disease in the right hip. Scoliosis with multilevel degenerative spine disease. These results were called by telephone at the time of interpretation on 11/11/2016 at 2:54 pm to Dr. Carmin Muskrat , who verbally acknowledged these results. Electronically Signed   By: Markus Daft M.D.   On: 11/11/2016 15:05      No results found for: HGBA1C Lab Results  Component Value Date   CREATININE 1.13 (H) 11/12/2016       Scheduled Meds: . sodium chloride flush  3 mL Intravenous Q12H   Continuous Infusions: . sodium chloride 75 mL/hr at 11/12/16 0115  . cefTRIAXone (ROCEPHIN)  IV Stopped (11/11/16 1646)  . heparin Stopped (11/12/16 0904)     LOS: 1 day    Time spent: >30 MINS    Reyne Dumas  [facility] Pager [phone]. If 7PM-7AM, please contact night-coverage at [email or web address], password [facility] 11/12/2016, 11:43 AM  LOS: 1 day

## 2016-11-12 NOTE — Progress Notes (Signed)
*  PRELIMINARY RESULTS* Vascular Ultrasound Left lower extremity venous duplex has been completed.  Preliminary findings: Isolated DVT noted in the left common femoral vein.    Landry Mellow, RDMS, RVT  11/12/2016, 9:59 AM

## 2016-11-13 ENCOUNTER — Inpatient Hospital Stay (HOSPITAL_COMMUNITY): Payer: Medicare Other

## 2016-11-13 DIAGNOSIS — K869 Disease of pancreas, unspecified: Secondary | ICD-10-CM

## 2016-11-13 DIAGNOSIS — C787 Secondary malignant neoplasm of liver and intrahepatic bile duct: Secondary | ICD-10-CM

## 2016-11-13 LAB — URINE CULTURE: Culture: >=100000 — AB

## 2016-11-13 LAB — CBC
HEMATOCRIT: 29.1 % — AB (ref 36.0–46.0)
Hemoglobin: 9.4 g/dL — ABNORMAL LOW (ref 12.0–15.0)
MCH: 26.9 pg (ref 26.0–34.0)
MCHC: 32.3 g/dL (ref 30.0–36.0)
MCV: 83.4 fL (ref 78.0–100.0)
Platelets: 193 10*3/uL (ref 150–400)
RBC: 3.49 MIL/uL — ABNORMAL LOW (ref 3.87–5.11)
RDW: 16.1 % — AB (ref 11.5–15.5)
WBC: 16.5 10*3/uL — ABNORMAL HIGH (ref 4.0–10.5)

## 2016-11-13 LAB — HEPARIN LEVEL (UNFRACTIONATED)
Heparin Unfractionated: 0.24 IU/mL — ABNORMAL LOW (ref 0.30–0.70)
Heparin Unfractionated: 0.6 IU/mL (ref 0.30–0.70)

## 2016-11-13 MED ORDER — PANTOPRAZOLE SODIUM 40 MG PO TBEC
40.0000 mg | DELAYED_RELEASE_TABLET | Freq: Every day | ORAL | Status: DC
  Administered 2016-11-13 – 2016-11-16 (×4): 40 mg via ORAL
  Filled 2016-11-13 (×5): qty 1

## 2016-11-13 MED ORDER — CEFDINIR 125 MG/5ML PO SUSR
300.0000 mg | Freq: Every day | ORAL | Status: DC
  Administered 2016-11-13 – 2016-11-15 (×3): 300 mg via ORAL
  Filled 2016-11-13 (×3): qty 15

## 2016-11-13 NOTE — Progress Notes (Signed)
ANTICOAGULATION CONSULT NOTE   Pharmacy Consult for Heparin Indication: DVT  No Known Allergies  Patient Measurements: Height: 5\' 2"  (157.5 cm) Weight: 104 lb 0.9 oz (47.2 kg) IBW/kg (Calculated) : 50.1  Heparin Dosing Weight: 50 kg  Vital Signs: Temp: 98.9 F (37.2 C) (08/07 0603) Temp Source: Oral (08/07 0603) BP: 116/63 (08/07 0603) Pulse Rate: 84 (08/07 0603)  Labs:  Recent Labs  11/11/16 1219 11/12/16 0136 11/12/16 0705 11/13/16 0543  HGB 12.1 9.6*  --   --   HCT 37.5 28.3*  --   --   PLT 267 188  --   --   LABPROT  --   --  16.5*  --   INR  --   --  1.32  --   HEPARINUNFRC  --  0.67  --  0.24*  CREATININE 1.50* 1.13*  --   --     Estimated Creatinine Clearance: 29.6 mL/min (A) (by C-G formula based on SCr of 1.13 mg/dL (H)).  Assessment: 83 y.o. female with DVT for heparin  Goal of Therapy:  Heparin level 0.3-0.7 units/ml Monitor platelets by anticoagulation protocol: Yes   Plan:  Increase Heparin  800 units/hr Check heparin level in 8 hours.  Phillis Knack, PharmD, BCPS

## 2016-11-13 NOTE — Plan of Care (Signed)
Problem: Safety: Goal: Ability to remain free from injury will improve Outcome: Progressing Patient aware of need to call for help in order to get out of bed or to go to bathroom. Patient given call light. Patient understands that she is physically weaker and tests pending to find cause for weakness.

## 2016-11-13 NOTE — Progress Notes (Signed)
          Daily Rounding Note  11/13/2016, 8:50 AM  LOS: 2 days   SUBJECTIVE:   Chief complaint:  abd pain, n/v are resolved.       Last pain med of Fentanyl was yesterday ~ 1620.   Appetite still diminished but tolerating soft diet  OBJECTIVE:         Vital signs in last 24 hours:    Temp:  [97.9 F (36.6 C)-98.9 F (37.2 C)] 98.9 F (37.2 C) (08/07 0603) Pulse Rate:  [67-88] 84 (08/07 0603) Resp:  [15-20] 20 (08/07 0603) BP: (106-125)/(57-84) 116/63 (08/07 0603) SpO2:  [98 %-100 %] 100 % (08/07 0603) Weight:  [47.2 kg (104 lb 0.9 oz)] 47.2 kg (104 lb 0.9 oz) (08/06 2141) Last BM Date: 11/09/16 Filed Weights   11/11/16 1800 11/11/16 2116 11/12/16 2141  Weight: 46.5 kg (102 lb 8 oz) 47.4 kg (104 lb 9.6 oz) 47.2 kg (104 lb 0.9 oz)   General: pail, aged, frail looking   Heart: RRR Chest: some rales at left base.   Abdomen: soft, NT, no mass.  BS active.  Thin.    Extremities: no CCE Neuro/Psych:  Pleasant, cooperative.  Flat affect: c/w general illness.    Hepatitis Panel No results for input(s): HEPBSAG, HCVAB, HEPAIGM, HEPBIGM in the last 72 hours.   ASSESMENT:   *  Pancreatic tail and liver lesions.  Neoplasm suspected. .  sxs include upper abd pain, N/V and recent anorexia, weight loss.  S/p liver mass biopsy on 11/12/16.  CA 19-9 is 75 K.    *  Right colon wall thickening.    *  New onset left CFA DVT.  Heparin in place.    *  Normocytic anemia.  Post rehydration.  Baseline not known.   *  CKD.  AKI improved   *  E coli UTI.  On PO Cefdinir  PLAN   *  Await path from liver biopsy.   Soft diet.  Does she need ct chest for investigation of abnormal lung findings,to r/o mets, as suggested on reading of CT ab/pelvis?     [name]  11/13/2016, 8:50 AM Pager: [phone]  Agree with Ms. Sandi Carne assessment and plan.  We will sign off - available if needed but think next steps will involve hem-onc  consultation - do not think GI has any role at this time.CT chest reasonable for staging completion  W/ CA19-9 75 K this is pancreatic cancer  Gatha Mayer, MD, [facility] [phone] (pager) 11/13/2016 4:32 PM   Gatha Mayer, MD, [facility]

## 2016-11-13 NOTE — Progress Notes (Signed)
ANTICOAGULATION CONSULT NOTE   Pharmacy Consult for Heparin Indication: DVT  No Known Allergies  Patient Measurements: Height: 5\' 2"  (157.5 cm) Weight: 104 lb 0.9 oz (47.2 kg) IBW/kg (Calculated) : 50.1  Heparin Dosing Weight: 50 kg  Vital Signs: Temp: 98.9 F (37.2 C) (08/07 0603) Temp Source: Oral (08/07 0603) BP: 116/63 (08/07 0603) Pulse Rate: 84 (08/07 0603)  Labs:  Recent Labs  11/11/16 1219 11/12/16 0136 11/12/16 0705 11/13/16 0543 11/13/16 0827 11/13/16 1530  HGB 12.1 9.6*  --   --  9.4*  --   HCT 37.5 28.3*  --   --  29.1*  --   PLT 267 188  --   --  193  --   LABPROT  --   --  16.5*  --   --   --   INR  --   --  1.32  --   --   --   HEPARINUNFRC  --  0.67  --  0.24*  --  0.60  CREATININE 1.50* 1.13*  --   --   --   --     Estimated Creatinine Clearance: 29.6 mL/min (A) (by C-G formula based on SCr of 1.13 mg/dL (H)).   Medical History: Past Medical History:  Diagnosis Date  . High cholesterol   . Hypertension     Assessment: 68 YOF on IV heparin for new DVT in L common femoral vein. Heparin level is WNL hgb 9.4, ptls ok   Goal of Therapy:  Heparin level 0.3-0.7 units/ml Monitor platelets by anticoagulation protocol: Yes   Plan:  -Continue heparin at 800 units/hr -Daily HL, CBC -F/u plan for oral anticoagulation    Harvel Quale  11/13/2016 4:27 PM

## 2016-11-13 NOTE — Progress Notes (Signed)
[facility] PROGRESS NOTE  Barry Culverhouse NLZ:[id number] DOB: 01-23-1934 DOA: 11/11/2016   PCP: Vincente Liberty, MD     Assessment/Plan: Principal Problem:   Acute kidney injury [facility]) Active Problems:   Hypertension   High cholesterol   Dehydration   Nausea & vomiting   Pancreatic mass   Liver lesions   ? DVT (deep venous thrombosis) Common Femoral vein   UTI (urinary tract infection)    83 y.o. female with medical history significant for hypertension and dyslipidemia who presented to the ER complaining of generalized weakness after expressing nausea and vomiting for 2 weeks with last emesis episode 3 days ago.  Unfortunately CT imaging of the abdomen and pelvis did reveal a 3.5 cm pancreatic tail lesion as well as multiple lesions on the liver concerning for metastatic disease. There also was question of a DVT involving the left common femoral vein. Patient is now status post  ultrasound-guided liver biopsy   Assessment and plan  Acute kidney injury/dehydration/metabolic acidemia  Creatinine improved from 1.5-1.13 -Patient presents to ER with generalized weakness with acute kidney injury in the setting of intractable nausea and vomiting, possible underlying malignancy Continue gentle hydration Renal function improving -Continue gentle IV fluid hydration -Hold preadmission ARB      3.5 cm Pancreatic tail mass/multiple Liver lesions  CT findings concerning for pancreatic malignancy with metastatic disease -GI consultation although given location of mass likely not a candidate for EUS -IR performed core biopsy of liver lesions 8/6 -Markedly elevated CA-19-9   >74k -Alk phosphatase elevated but otherwise LFTs normal and does not seem to have an obstructive biliary tree process at this time -Likely will need oncology consultation once biopsy results are available, to determine if appropriate for any modality of treatment even palliative chemotherapy -Currently  patient full code,  may be palliative care consultation for goals of care once diagnosis is clarified CT chest for staging   Anemia Hemoglobin drop from 12.1> 9.6 Follow closely while the patient is on anticoagulation FOBT       Nausea & vomiting -Secondary to presumed pancreatic malignancy Advance to soft diet -Zofran and low-dose Phenergan for nausea    ? DVT (deep venous thrombosis) Common Femoral vein  Isolated DVT noted in the left common femoral vein, continue heparin gtt ,for now. May need to switch to Lovenox long-term in the setting of underlying malignancy      UTI (urinary tract infection) Initially treated with IV Rocephin Urine culture positive for Escherichia coli, pansensitive, will switch to Omnicef    Hypertension -Current blood pressure suboptimal in setting of dehydration and acute kidney injury -Hold preadmission Benicar    High cholesterol -Hold Crestor     DVT prophylaxsis   Code Status:      Family Communication: Discussed in detail with the patient's daughter in law , all imaging results, lab results explained to the patient   Disposition Plan  Pending improvement, diagnosis     Consultants:  Interventional radiology   gi  Procedures:  Ultrasound-guided biopsy   Antibiotics: Anti-infectives    Start     Dose/Rate Route Frequency Ordered Stop   11/11/16 1630  cefTRIAXone (ROCEPHIN) 1 g in dextrose 5 % 50 mL IVPB     1 g 100 mL/hr over 30 Minutes Intravenous Every 24 hours 11/11/16 1600           HPI/Subjective: Has abdominal pain, denies nausea   Objective: Vitals:   11/12/16 1735 11/12/16 1856 11/12/16  2141 11/13/16 0603  BP: (!) 114/59 115/70 (!) 106/58 116/63  Pulse: 73 79 82 84  Resp: '16 16 19 20  '$ Temp:  97.9 F (36.6 C) 98.7 F (37.1 C) 98.9 F (37.2 C)  TempSrc:  Oral Oral Oral  SpO2: 98% 98% 99% 100%  Weight:   47.2 kg (104 lb 0.9 oz)   Height:        Intake/Output Summary (Last 24 hours) at  11/13/16 0819 Last data filed at 11/13/16 9485  Gross per 24 hour  Intake          2092.25 ml  Output              400 ml  Net          1692.25 ml    Exam:  Examination:  General exam: frail Respiratory system: Clear to auscultation. Respiratory effort normal. Cardiovascular system: S1 & S2 heard, RRR. No JVD, murmurs, rubs, gallops or clicks. No pedal edema. Gastrointestinal system: Abdomen is nondistended, soft and nontender. No organomegaly or masses felt. Normal bowel sounds heard. Central nervous system: Alert and oriented. No focal neurological deficits. Extremities: Symmetric 5 x 5 power. Skin: No rashes, lesions or ulcers Psychiatry: Judgement and insight appear normal. Mood & affect appropriate.     Data Reviewed: I have personally reviewed following labs and imaging studies  Micro Results Recent Results (from the past 240 hour(s))  Culture, Urine     Status: Abnormal   Collection Time: 11/11/16  3:20 PM  Result Value Ref Range Status   Specimen Description URINE, CATHETERIZED  Final   Special Requests NONE  Final   Culture >=100,000 COLONIES/mL ESCHERICHIA COLI (A)  Final   Report Status 11/13/2016 FINAL  Final   Organism ID, Bacteria ESCHERICHIA COLI (A)  Final      Susceptibility   Escherichia coli - MIC*    AMPICILLIN 8 SENSITIVE Sensitive     CEFAZOLIN <=4 SENSITIVE Sensitive     CEFTRIAXONE <=1 SENSITIVE Sensitive     CIPROFLOXACIN <=0.25 SENSITIVE Sensitive     GENTAMICIN <=1 SENSITIVE Sensitive     IMIPENEM <=0.25 SENSITIVE Sensitive     NITROFURANTOIN <=16 SENSITIVE Sensitive     TRIMETH/SULFA <=20 SENSITIVE Sensitive     AMPICILLIN/SULBACTAM <=2 SENSITIVE Sensitive     PIP/TAZO <=4 SENSITIVE Sensitive     Extended ESBL NEGATIVE Sensitive     * >=100,000 COLONIES/mL ESCHERICHIA COLI  Culture, blood (Routine X 2) w Reflex to ID Panel     Status: None (Preliminary result)   Collection Time: 11/11/16  6:19 PM  Result Value Ref Range Status   Specimen  Description BLOOD RIGHT HAND  Final   Special Requests   Final    BOTTLES DRAWN AEROBIC ONLY Blood Culture adequate volume   Culture NO GROWTH < 24 HOURS  Final   Report Status PENDING  Incomplete  Culture, blood (Routine X 2) w Reflex to ID Panel     Status: None (Preliminary result)   Collection Time: 11/11/16  6:26 PM  Result Value Ref Range Status   Specimen Description BLOOD LEFT HAND  Final   Special Requests IN PEDIATRIC BOTTLE Blood Culture adequate volume  Final   Culture NO GROWTH < 24 HOURS  Final   Report Status PENDING  Incomplete    Radiology Reports Ct Abdomen Pelvis W Contrast  Result Date: 11/11/2016 CLINICAL DATA:  83 year old with nausea and vomiting. Symptoms started 2 weeks ago. Abdominal pain. EXAM: CT ABDOMEN AND PELVIS  WITH CONTRAST TECHNIQUE: Multidetector CT imaging of the abdomen and pelvis was performed using the standard protocol following bolus administration of intravenous contrast. CONTRAST:  86m ISOVUE-300 IOPAMIDOL (ISOVUE-300) INJECTION 61% COMPARISON:  None. FINDINGS: Lower chest: 4 mm nodule in the lingula on sequence 5, image 16. Bandlike density in the left lower lobe on sequence 5, image 15 measures 5 mm in width but the length is roughly 21 mm. No large pleural effusions. Hepatobiliary: Innumerable low-density hepatic lesions throughout both lobes of the liver. Index lesion in the right hepatic lobe measuring 5.5 x 3.4 cm on sequence 3, image 18. Second index lesion is in the inferior right hepatic lobe measuring 6.1 x 5.1 cm on image 38. Small amount of perihepatic edema and ascites. Main portal venous system appears to be patent. Liver has a nodular contour. There is mild intrahepatic biliary dilatation in the anterior right hepatic lobe on sequence 3, image 15. Small amount of fluid around the gallbladder. No gross abnormality to the gallbladder. No significant extrahepatic biliary dilatation. Pancreas: Lobulated low-density lesion at the pancreatic tail  measuring 3.5 x 2.5 cm on sequence 3, image 18. Findings are suggestive for a primary pancreatic neoplasm. No significant pancreatic duct dilatation. Spleen: Normal appearance of the spleen was minimal perisplenic ascites. Adrenals/Urinary Tract: Normal adrenal glands. Small cyst in the right kidney upper pole. No suspicious renal lesions. Negative for hydronephrosis. Wall thickening along the right side of the urinary bladder. No definite renal or urinary stones. Stomach/Bowel: No significant bowel dilatation. No gross abnormality to the stomach or duodenum. The appendix appears to be normal. Mild asymmetric wall thickening in the ascending colon on sequence 3, image 41 is nonspecific and could be related to incomplete distension. Normal appearance of the terminal ileum. Vascular/Lymphatic: Atherosclerotic calcifications in the aorta and iliac arteries without aortic aneurysm. SMA is small but appears to be patent. Celiac trunk is patent. IMA appears to be patent. The infrarenal IVC is compressed to due to the enlarged right hepatic lobe. Enlarged lymph node at porta hepatis measuring 1.4 cm in the short axis on sequence 6 image 21. Mild expansion of the left common femoral vein and concern for a filling defect in left common femoral vein. Reproductive: Multiple calcifications in the uterus. No suspicious adnexal lesions. Other: Small amount of free fluid in the pelvis. Negative for free air. Musculoskeletal: Levoscoliosis of the thoracolumbar spine. Severe degenerative disc disease at T12-L1. Severe joint space loss and degenerative changes in the right hip joint. Subtle areas of lucency in the pelvic bones probably related to osteoporosis. IMPRESSION: 3.5 cm pancreatic lesion with innumerable liver lesions. Findings are most compatible with metastatic pancreatic cancer. Liver lesions would be amenable to percutaneous biopsy. Concern for a DVT in the left common femoral vein. Recommend a lower extremity venous  duplex examination. Small amount of abdominal ascites. Mild lymphadenopathy in the upper abdomen. Mild wall thickening in the ascending colon is nonspecific. Neoplastic process in the right colon is thought to be less likely but cannot be completely excluded. Indeterminate nodules and densities at the left lung base. Recommend follow-up chest CT to evaluate for metastatic chest disease. Wall thickening along the right side of the urinary bladder. Findings could be related to cystitis but indeterminate. Consider non emergent Urology consultation. Severe degenerative disease in the right hip. Scoliosis with multilevel degenerative spine disease. These results were called by telephone at the time of interpretation on 11/11/2016 at 2:54 pm to Dr. RCarmin Muskrat, who verbally acknowledged these  results. Electronically Signed   By: Markus Daft M.D.   On: 11/11/2016 15:05     CBC  Recent Labs Lab 11/11/16 1219 11/12/16 0136  WBC 15.9* 15.2*  HGB 12.1 9.6*  HCT 37.5 28.3*  PLT 267 188  MCV 85.0 82.3  MCH 27.4 27.3  MCHC 32.3 33.2  RDW 15.9* 15.6*  LYMPHSABS  --  1.2  MONOABS  --  1.7*  EOSABS  --  0.4  BASOSABS  --  0.0    Chemistries   Recent Labs Lab 11/11/16 1219 11/11/16 1908 11/12/16 0136  NA 137  --  137  K 5.2*  --  4.5  CL 101  --  108  CO2 18*  --  19*  GLUCOSE 123*  --  103*  BUN 56*  --  41*  CREATININE 1.50*  --  1.13*  CALCIUM 9.8  --  8.4*  MG  --  2.3  --   AST 26  --  17  ALT 16  --  12*  ALKPHOS 322*  --  210*  BILITOT 1.2  --  1.0   ------------------------------------------------------------------------------------------------------------------ estimated creatinine clearance is 29.6 mL/min (A) (by C-G formula based on SCr of 1.13 mg/dL (H)). ------------------------------------------------------------------------------------------------------------------ No results for input(s): HGBA1C in the last 72  hours. ------------------------------------------------------------------------------------------------------------------ No results for input(s): CHOL, HDL, LDLCALC, TRIG, CHOLHDL, LDLDIRECT in the last 72 hours. ------------------------------------------------------------------------------------------------------------------ No results for input(s): TSH, T4TOTAL, T3FREE, THYROIDAB in the last 72 hours.  Invalid input(s): FREET3 ------------------------------------------------------------------------------------------------------------------ No results for input(s): VITAMINB12, FOLATE, FERRITIN, TIBC, IRON, RETICCTPCT in the last 72 hours.  Coagulation profile  Recent Labs Lab 11/12/16 0705  INR 1.32    No results for input(s): DDIMER in the last 72 hours.  Cardiac Enzymes No results for input(s): CKMB, TROPONINI, MYOGLOBIN in the last 168 hours.  Invalid input(s): CK ------------------------------------------------------------------------------------------------------------------ Invalid input(s): POCBNP   CBG: No results for input(s): GLUCAP in the last 168 hours.     Studies: Ct Abdomen Pelvis W Contrast  Result Date: 11/11/2016 CLINICAL DATA:  83 year old with nausea and vomiting. Symptoms started 2 weeks ago. Abdominal pain. EXAM: CT ABDOMEN AND PELVIS WITH CONTRAST TECHNIQUE: Multidetector CT imaging of the abdomen and pelvis was performed using the standard protocol following bolus administration of intravenous contrast. CONTRAST:  49m ISOVUE-300 IOPAMIDOL (ISOVUE-300) INJECTION 61% COMPARISON:  None. FINDINGS: Lower chest: 4 mm nodule in the lingula on sequence 5, image 16. Bandlike density in the left lower lobe on sequence 5, image 15 measures 5 mm in width but the length is roughly 21 mm. No large pleural effusions. Hepatobiliary: Innumerable low-density hepatic lesions throughout both lobes of the liver. Index lesion in the right hepatic lobe measuring 5.5 x 3.4 cm  on sequence 3, image 18. Second index lesion is in the inferior right hepatic lobe measuring 6.1 x 5.1 cm on image 38. Small amount of perihepatic edema and ascites. Main portal venous system appears to be patent. Liver has a nodular contour. There is mild intrahepatic biliary dilatation in the anterior right hepatic lobe on sequence 3, image 15. Small amount of fluid around the gallbladder. No gross abnormality to the gallbladder. No significant extrahepatic biliary dilatation. Pancreas: Lobulated low-density lesion at the pancreatic tail measuring 3.5 x 2.5 cm on sequence 3, image 18. Findings are suggestive for a primary pancreatic neoplasm. No significant pancreatic duct dilatation. Spleen: Normal appearance of the spleen was minimal perisplenic ascites. Adrenals/Urinary Tract: Normal adrenal glands. Small cyst in the right kidney upper  pole. No suspicious renal lesions. Negative for hydronephrosis. Wall thickening along the right side of the urinary bladder. No definite renal or urinary stones. Stomach/Bowel: No significant bowel dilatation. No gross abnormality to the stomach or duodenum. The appendix appears to be normal. Mild asymmetric wall thickening in the ascending colon on sequence 3, image 41 is nonspecific and could be related to incomplete distension. Normal appearance of the terminal ileum. Vascular/Lymphatic: Atherosclerotic calcifications in the aorta and iliac arteries without aortic aneurysm. SMA is small but appears to be patent. Celiac trunk is patent. IMA appears to be patent. The infrarenal IVC is compressed to due to the enlarged right hepatic lobe. Enlarged lymph node at porta hepatis measuring 1.4 cm in the short axis on sequence 6 image 21. Mild expansion of the left common femoral vein and concern for a filling defect in left common femoral vein. Reproductive: Multiple calcifications in the uterus. No suspicious adnexal lesions. Other: Small amount of free fluid in the pelvis. Negative  for free air. Musculoskeletal: Levoscoliosis of the thoracolumbar spine. Severe degenerative disc disease at T12-L1. Severe joint space loss and degenerative changes in the right hip joint. Subtle areas of lucency in the pelvic bones probably related to osteoporosis. IMPRESSION: 3.5 cm pancreatic lesion with innumerable liver lesions. Findings are most compatible with metastatic pancreatic cancer. Liver lesions would be amenable to percutaneous biopsy. Concern for a DVT in the left common femoral vein. Recommend a lower extremity venous duplex examination. Small amount of abdominal ascites. Mild lymphadenopathy in the upper abdomen. Mild wall thickening in the ascending colon is nonspecific. Neoplastic process in the right colon is thought to be less likely but cannot be completely excluded. Indeterminate nodules and densities at the left lung base. Recommend follow-up chest CT to evaluate for metastatic chest disease. Wall thickening along the right side of the urinary bladder. Findings could be related to cystitis but indeterminate. Consider non emergent Urology consultation. Severe degenerative disease in the right hip. Scoliosis with multilevel degenerative spine disease. These results were called by telephone at the time of interpretation on 11/11/2016 at 2:54 pm to Dr. Carmin Muskrat , who verbally acknowledged these results. Electronically Signed   By: Markus Daft M.D.   On: 11/11/2016 15:05      No results found for: HGBA1C Lab Results  Component Value Date   CREATININE 1.13 (H) 11/12/2016       Scheduled Meds: . latanoprost  1 drop Both Eyes QHS  . pantoprazole  40 mg Oral Daily  . rosuvastatin  20 mg Oral QHS  . sodium chloride flush  3 mL Intravenous Q12H  . timolol  1 drop Both Eyes Daily  . [START ON 11/16/2016] Vitamin D (Ergocalciferol)  50,000 Units Oral Q7 days   Continuous Infusions: . sodium chloride 75 mL/hr at 11/13/16 0044  . cefTRIAXone (ROCEPHIN)  IV Stopped (11/12/16  1731)  . heparin 800 Units/hr (11/13/16 0724)     LOS: 2 days    Time spent: >30 MINS    Reyne Dumas  [facility] Pager [phone]. If 7PM-7AM, please contact night-coverage at [email or web address], password [facility] 11/13/2016, 8:19 AM  LOS: 2 days

## 2016-11-14 DIAGNOSIS — E7800 Pure hypercholesterolemia, unspecified: Secondary | ICD-10-CM

## 2016-11-14 DIAGNOSIS — N3000 Acute cystitis without hematuria: Secondary | ICD-10-CM

## 2016-11-14 DIAGNOSIS — I82401 Acute embolism and thrombosis of unspecified deep veins of right lower extremity: Secondary | ICD-10-CM

## 2016-11-14 DIAGNOSIS — I10 Essential (primary) hypertension: Secondary | ICD-10-CM

## 2016-11-14 DIAGNOSIS — N179 Acute kidney failure, unspecified: Principal | ICD-10-CM

## 2016-11-14 LAB — COMPREHENSIVE METABOLIC PANEL
ALK PHOS: 162 U/L — AB (ref 38–126)
ALT: 11 U/L — ABNORMAL LOW (ref 14–54)
ANION GAP: 9 (ref 5–15)
AST: 16 U/L (ref 15–41)
Albumin: 1.7 g/dL — ABNORMAL LOW (ref 3.5–5.0)
BUN: 18 mg/dL (ref 6–20)
CALCIUM: 8 mg/dL — AB (ref 8.9–10.3)
CO2: 17 mmol/L — AB (ref 22–32)
Chloride: 113 mmol/L — ABNORMAL HIGH (ref 101–111)
Creatinine, Ser: 0.83 mg/dL (ref 0.44–1.00)
GFR calc non Af Amer: >60 mL/min (ref >60)
Glucose, Bld: 92 mg/dL (ref 65–99)
Potassium: 4.1 mmol/L (ref 3.5–5.1)
SODIUM: 139 mmol/L (ref 135–145)
Total Bilirubin: 0.7 mg/dL (ref 0.3–1.2)
Total Protein: 4.5 g/dL — ABNORMAL LOW (ref 6.5–8.1)

## 2016-11-14 LAB — CBC
HCT: 27.7 % — ABNORMAL LOW (ref 36.0–46.0)
HEMOGLOBIN: 9.1 g/dL — AB (ref 12.0–15.0)
MCH: 27.9 pg (ref 26.0–34.0)
MCHC: 32.9 g/dL (ref 30.0–36.0)
MCV: 85 fL (ref 78.0–100.0)
Platelets: 186 10*3/uL (ref 150–400)
RBC: 3.26 MIL/uL — AB (ref 3.87–5.11)
RDW: 16.6 % — ABNORMAL HIGH (ref 11.5–15.5)
WBC: 14.8 10*3/uL — ABNORMAL HIGH (ref 4.0–10.5)

## 2016-11-14 LAB — HEPARIN LEVEL (UNFRACTIONATED): HEPARIN UNFRACTIONATED: 0.48 [IU]/mL (ref 0.30–0.70)

## 2016-11-14 NOTE — Progress Notes (Signed)
PROGRESS NOTE   Alexandra Mcmahon  WIO:[id number]    DOB: [birth year]    DOA: 11/11/2016  PCP: Vincente Liberty, MD   I have briefly reviewed patients previous medical records in [facility].  Brief Narrative:  83 year old female with PMH of HTN and HLD presented to the ED with complaints of generalized weakness, nausea and vomiting 2 weeks but no abdominal pain. CT abdomen and pelvis showed 3.5 cm pancreatic tail lesion as well as multiple lesions in the liver concerning for metastatic disease and question of DVT involving left common femoral vein. Patient status post ultrasound-guided liver biopsy on 8/6, pathology pending.   Assessment & Plan:   Principal Problem:   Acute kidney injury ([place]) Active Problems:   Hypertension   High cholesterol   Dehydration   Nausea & vomiting   Pancreatic mass   Liver lesions   ? DVT (deep venous thrombosis) Common Femoral vein   UTI (urinary tract infection)   Liver metastases ([place])   1. Pancreatic tail & liver lesions, pancreatic cancer suspected. [facility] input appreciated and signed off 8/7. CA 19-9: 75K. Status post liver mass biopsy by IR on 11/12/16, pathology pending. CT chest shows no obvious pulmonary metastatic lesions. Oncology consultation ending pathology results. 2. Left common femoral vein DVT: Currently on IV heparin drip. May need to switch to long-term Lovenox in the setting of malignancy. 3. Escherichia coli Acute lower UTI: Initially treated with IV Rocephin and then transitioned to oral Omnicef. 4. Essential hypertension: Controlled. Holding preadmission Benicar. 5. Hyperlipidemia: Holding Crestor due to abnormal LFTs. 6. Acute kidney injury: Likely related to poor oral intake and GI losses. Held ARB. Hydrated with IV fluids. Acute kidney injury resolved. 7. Anemia: Likely related to chronic disease and malignancy. Stable. 8. Non anion gap metabolic acidosis: Unclear etiology.? RTA. Mild hyperchloremia probably not the full  cause. Follow BMP in a.m. May consider starting sodium bicarbonate tablets. 9. CAD: Seen on CTA chest. Asymptomatic. 10. Right lower lobe consolidation: Seen on CT chest but no symptoms suggestive of pneumonia. Remains on oral antibiotics for UTI.   DVT prophylaxis: Currently on IV heparin drip Code Status: Full Family Communication: Discussed in detail with patient's daughter-in-law at bedside. Disposition: To be determined   Consultants:   GI Interventional radiology   Procedures:  Status post ultrasound guided liver lesion: Biopsy 11/12/16  Antimicrobials:  IV Rocephin, discontinued Cefdinir    Subjective: Seen this morning. Stated that she is "okay". Denied abdominal pain. Reports that she had BM last week. No nausea or vomiting.   ROS: Denies pain elsewhere, dyspnea or chest pain.  Objective:  Vitals:   11/13/16 1500 11/13/16 2058 11/14/16 0500 11/14/16 0900  BP: (!) 120/58 118/74 127/66 121/67  Pulse: 95 74 73 94  Resp: 20 20 20 18   Temp: 99 F (37.2 C) 97.9 F (36.6 C) 98 F (36.7 C) 97.6 F (36.4 C)  TempSrc: Oral Oral Oral Oral  SpO2: 99% 100% 99% 98%  Weight:  47.6 kg (105 lb)    Height:        Examination:  General exam: Pleasant elderly female, small built, frail and chronically ill looking, lying comfortably supine in bed. Respiratory system: Clear to auscultation. Respiratory effort normal. Cardiovascular system: S1 & S2 heard, RRR. No JVD, murmurs, rubs, gallops or clicks. No pedal edema. Telemetry: Sinus rhythm. Gastrointestinal system: Abdomen is nondistended, soft and nontender. No organomegaly or masses felt. Normal bowel sounds heard. Central nervous system: Alert and oriented. No focal  neurological deficits. Extremities: Symmetric 5 x 5 power. Skin: No rashes, lesions or ulcers Psychiatry: Judgement and insight appear normal. Mood & affect flat.     Data Reviewed: I have personally reviewed following labs and imaging  studies  CBC:  Recent Labs Lab 11/11/16 1219 11/12/16 0136 11/13/16 0827 11/14/16 0441  WBC 15.9* 15.2* 16.5* 14.8*  NEUTROABS  --  11.8*  --   --   HGB 12.1 9.6* 9.4* 9.1*  HCT 37.5 28.3* 29.1* 27.7*  MCV 85.0 82.3 83.4 85.0  PLT 267 188 193 196   Basic Metabolic Panel:  Recent Labs Lab 11/11/16 1219 11/11/16 1908 11/12/16 0136 11/14/16 0441  NA 137  --  137 139  K 5.2*  --  4.5 4.1  CL 101  --  108 113*  CO2 18*  --  19* 17*  GLUCOSE 123*  --  103* 92  BUN 56*  --  41* 18  CREATININE 1.50*  --  1.13* 0.83  CALCIUM 9.8  --  8.4* 8.0*  MG  --  2.3  --   --    Liver Function Tests:  Recent Labs Lab 11/11/16 1219 11/12/16 0136 11/14/16 0441  AST 26 17 16   ALT 16 12* 11*  ALKPHOS 322* 210* 162*  BILITOT 1.2 1.0 0.7  PROT 7.4 5.0* 4.5*  ALBUMIN 2.9* 2.1* 1.7*   Coagulation Profile:  Recent Labs Lab 11/12/16 0705  INR 1.32   Cardiac Enzymes: No results for input(s): CKTOTAL, CKMB, CKMBINDEX, TROPONINI in the last 168 hours. HbA1C: No results for input(s): HGBA1C in the last 72 hours. CBG: No results for input(s): GLUCAP in the last 168 hours.  Recent Results (from the past 240 hour(s))  Culture, Urine     Status: Abnormal   Collection Time: 11/11/16  3:20 PM  Result Value Ref Range Status   Specimen Description URINE, CATHETERIZED  Final   Special Requests NONE  Final   Culture >=100,000 COLONIES/mL ESCHERICHIA COLI (A)  Final   Report Status 11/13/2016 FINAL  Final   Organism ID, Bacteria ESCHERICHIA COLI (A)  Final      Susceptibility   Escherichia coli - MIC*    AMPICILLIN 8 SENSITIVE Sensitive     CEFAZOLIN <=4 SENSITIVE Sensitive     CEFTRIAXONE <=1 SENSITIVE Sensitive     CIPROFLOXACIN <=0.25 SENSITIVE Sensitive     GENTAMICIN <=1 SENSITIVE Sensitive     IMIPENEM <=0.25 SENSITIVE Sensitive     NITROFURANTOIN <=16 SENSITIVE Sensitive     TRIMETH/SULFA <=20 SENSITIVE Sensitive     AMPICILLIN/SULBACTAM <=2 SENSITIVE Sensitive      PIP/TAZO <=4 SENSITIVE Sensitive     Extended ESBL NEGATIVE Sensitive     * >=100,000 COLONIES/mL ESCHERICHIA COLI  Culture, blood (Routine X 2) w Reflex to ID Panel     Status: None (Preliminary result)   Collection Time: 11/11/16  6:19 PM  Result Value Ref Range Status   Specimen Description BLOOD RIGHT HAND  Final   Special Requests   Final    BOTTLES DRAWN AEROBIC ONLY Blood Culture adequate volume   Culture NO GROWTH 3 DAYS  Final   Report Status PENDING  Incomplete  Culture, blood (Routine X 2) w Reflex to ID Panel     Status: None (Preliminary result)   Collection Time: 11/11/16  6:26 PM  Result Value Ref Range Status   Specimen Description BLOOD LEFT HAND  Final   Special Requests IN PEDIATRIC BOTTLE Blood Culture adequate volume  Final  Culture NO GROWTH 3 DAYS  Final   Report Status PENDING  Incomplete         Radiology Studies: Ct Chest Wo Contrast  Result Date: 11/13/2016 CLINICAL DATA:  CT of the abdomen and pelvis demonstrating mass of the tail of the pancreas and widespread hepatic metastatic disease. Imaged lung bases showed a 4 mm inferior lingular nodule and bandlike area of opacity at the base of the left lower lobe. Status post liver lesion biopsy. EXAM: CT CHEST WITHOUT CONTRAST TECHNIQUE: Multidetector CT imaging of the chest was performed following the standard protocol without IV contrast. COMPARISON:  CT of the abdomen on 11/11/2016 FINDINGS: Cardiovascular: Normal heart size. No pericardial effusion. Scattered calcified plaque is noted in the distribution of the LAD and left circumflex coronary arteries. Scattered atherosclerotic plaque in the thoracic aorta without evidence of aneurysmal disease. Mediastinum/Nodes: No obvious mediastinal masses. No overt lymphadenopathy identified. Lungs/Pleura: Since the prior abdominal CT, there is a new small right pleural effusion and dense atelectasis/ consolidation involving the posterior aspect of the right lower lobe.  Although this may be on the basis of atelectasis, correlation suggested with any clinical evidence to suggest pneumonia or aspiration. There is a very small left pleural effusion. No evidence to suggest obvious pulmonary metastatic disease. Scarring is present at the left lung base and there is a stable small inferior lingular nodule measuring approximately 4 mm. Upper Abdomen: Visible metastatic disease of the liver and free fluid adjacent to the liver. Musculoskeletal: No evidence of bone metastases or fractures. Diffuse degenerative disc disease is present throughout the thoracic spine. IMPRESSION: 1. Coronary atherosclerosis with scattered calcified plaque in the distribution of the LAD and left circumflex coronary arteries. The thoracic aorta also demonstrates atherosclerosis without aneurysmal disease. 2. New small right pleural effusion and dense consolidation of the posterior right lower lobe since the prior abdominal CT 2 days ago. This raises the possibility of pneumonia or aspiration. There is a small left pleural effusion. 3. Stable isolated 4 mm nodule in the inferior lingula. No other discrete pulmonary nodules are identified to suggest obvious pulmonary metastatic disease. Aortic Atherosclerosis (ICD10-I70.0). Electronically Signed   By: Aletta Edouard M.D.   On: 11/13/2016 22:17        Scheduled Meds: . cefdinir  300 mg Oral Daily  . latanoprost  1 drop Both Eyes QHS  . pantoprazole  40 mg Oral Daily  . rosuvastatin  20 mg Oral QHS  . sodium chloride flush  3 mL Intravenous Q12H  . timolol  1 drop Both Eyes Daily  . [START ON 11/16/2016] Vitamin D (Ergocalciferol)  50,000 Units Oral Q7 days   Continuous Infusions: . sodium chloride 75 mL/hr at 11/14/16 0543  . heparin 800 Units/hr (11/13/16 0724)     LOS: 3 days     Milad Bublitz, MD, FACP, FHM. [facility] Pager [phone]  If 7PM-7AM, please contact night-coverage [email or web address] Password [email or web address] 11/14/2016, 5:46  PM

## 2016-11-14 NOTE — Progress Notes (Signed)
ANTICOAGULATION CONSULT NOTE   Pharmacy Consult for Heparin Indication: DVT  No Known Allergies  Patient Measurements: Height: 5\' 2"  (157.5 cm) Weight: 105 lb (47.6 kg) IBW/kg (Calculated) : 50.1  Heparin Dosing Weight: 50 kg  Vital Signs: Temp: 98 F (36.7 C) (08/08 0500) Temp Source: Oral (08/08 0500) BP: 127/66 (08/08 0500) Pulse Rate: 73 (08/08 0500)  Labs:  Recent Labs  11/11/16 1219  11/12/16 0136 11/12/16 0705 11/13/16 0543 11/13/16 0827 11/13/16 1530 11/14/16 0441  HGB 12.1  --  9.6*  --   --  9.4*  --  9.1*  HCT 37.5  --  28.3*  --   --  29.1*  --  27.7*  PLT 267  --  188  --   --  193  --  186  LABPROT  --   --   --  16.5*  --   --   --   --   INR  --   --   --  1.32  --   --   --   --   HEPARINUNFRC  --   < > 0.67  --  0.24*  --  0.60 0.48  CREATININE 1.50*  --  1.13*  --   --   --   --  0.83  < > = values in this interval not displayed.  Estimated Creatinine Clearance: 40.6 mL/min (by C-G formula based on SCr of 0.83 mg/dL).   Medical History: Past Medical History:  Diagnosis Date  . High cholesterol   . Hypertension     Assessment: 43 YOF on IV heparin for new DVT in L common femoral vein. Heparin level is WNL on heparin at 800 units/hr CBC stable, no overt bleeding or complications noted.   Goal of Therapy:  Heparin level 0.3-0.7 units/ml Monitor platelets by anticoagulation protocol: Yes   Plan:  -Continue heparin at 800 units/hr -Daily heparin level, CBC -F/u plan for oral anticoagulation  Uvaldo Rising, BCPS  Clinical Pharmacist Pager [phone]  11/14/2016 8:45 AM

## 2016-11-14 NOTE — Plan of Care (Signed)
Problem: Health Behavior/Discharge Planning: Goal: Ability to manage health-related needs will improve Outcome: Progressing Will educate patient on need for oral anticoagulation for DVTs. Will also educate family and discuss with MD about oral anticoagulation plan before discharge. Patient scheduled to have PT eval today to see what needs will be when she goes home. Per family, patient has become increasingly weaker. Family aware that biopsy results are still pending and that they may need to follow up with oncology outpatient.     Problem: Nutritional: Goal: Ability to make appriopriate dietary choices will improve Outcome: Progressing Patient felt nauseous

## 2016-11-15 DIAGNOSIS — I82412 Acute embolism and thrombosis of left femoral vein: Secondary | ICD-10-CM

## 2016-11-15 DIAGNOSIS — C787 Secondary malignant neoplasm of liver and intrahepatic bile duct: Secondary | ICD-10-CM

## 2016-11-15 DIAGNOSIS — C799 Secondary malignant neoplasm of unspecified site: Secondary | ICD-10-CM

## 2016-11-15 DIAGNOSIS — C252 Malignant neoplasm of tail of pancreas: Secondary | ICD-10-CM

## 2016-11-15 LAB — BASIC METABOLIC PANEL
ANION GAP: 8 (ref 5–15)
BUN: 13 mg/dL (ref 6–20)
CALCIUM: 7.9 mg/dL — AB (ref 8.9–10.3)
CO2: 19 mmol/L — AB (ref 22–32)
CREATININE: 0.87 mg/dL (ref 0.44–1.00)
Chloride: 111 mmol/L (ref 101–111)
Glucose, Bld: 98 mg/dL (ref 65–99)
Potassium: 3.5 mmol/L (ref 3.5–5.1)
SODIUM: 138 mmol/L (ref 135–145)

## 2016-11-15 LAB — CBC
HEMATOCRIT: 26.7 % — AB (ref 36.0–46.0)
Hemoglobin: 8.9 g/dL — ABNORMAL LOW (ref 12.0–15.0)
MCH: 27.9 pg (ref 26.0–34.0)
MCHC: 33.3 g/dL (ref 30.0–36.0)
MCV: 83.7 fL (ref 78.0–100.0)
PLATELETS: 180 10*3/uL (ref 150–400)
RBC: 3.19 MIL/uL — ABNORMAL LOW (ref 3.87–5.11)
RDW: 16.7 % — AB (ref 11.5–15.5)
WBC: 14.1 10*3/uL — AB (ref 4.0–10.5)

## 2016-11-15 LAB — HEPARIN LEVEL (UNFRACTIONATED): HEPARIN UNFRACTIONATED: 0.52 [IU]/mL (ref 0.30–0.70)

## 2016-11-15 NOTE — Progress Notes (Signed)
ANTICOAGULATION CONSULT NOTE   Pharmacy Consult for Heparin Indication: DVT  No Known Allergies  Patient Measurements: Height: 5\' 2"  (157.5 cm) Weight: 106 lb (48.1 kg) IBW/kg (Calculated) : 50.1  Heparin Dosing Weight: 50 kg  Vital Signs: Temp: 97.4 F (36.3 C) (08/09 0509) Temp Source: Oral (08/09 0509) BP: 108/59 (08/09 0509) Pulse Rate: 79 (08/09 0509)  Labs:  Recent Labs  11/13/16 0827 11/13/16 1530 11/14/16 0441 11/15/16 0416  HGB 9.4*  --  9.1* 8.9*  HCT 29.1*  --  27.7* 26.7*  PLT 193  --  186 180  HEPARINUNFRC  --  0.60 0.48 0.52  CREATININE  --   --  0.83 0.87    Estimated Creatinine Clearance: 39.2 mL/min (by C-G formula based on SCr of 0.87 mg/dL).   Medical History: Past Medical History:  Diagnosis Date  . High cholesterol   . Hypertension     Assessment: 46 YOF on IV heparin for new DVT in L common femoral vein. Heparin level is WNL on heparin at 800 units/hr CBC stable, no overt bleeding or complications noted.   Goal of Therapy:  Heparin level 0.3-0.7 units/ml Monitor platelets by anticoagulation protocol: Yes   Plan:  -Continue heparin at 800 units/hr -Daily heparin level, CBC -F/u plan for oral anticoagulation  Thank you Anette Guarneri, PharmD [phone]  11/15/2016 8:44 AM

## 2016-11-15 NOTE — Care Management Important Message (Signed)
Important Message  Patient Details  Name: Alexandra Mcmahon MRN: [id number] Date of Birth: Mar 18, 1934   Medicare Important Message Given:  Yes    Lorilynn Lehr 11/15/2016, 12:17 PM

## 2016-11-15 NOTE — Progress Notes (Addendum)
PROGRESS NOTE   Alexandra Mcmahon  UEK:[id number]    DOB: 09/16/1933    DOA: 11/11/2016  PCP: Vincente Liberty, MD   I have briefly reviewed patients previous medical records in [facility].  Brief Narrative:  83 year old female with PMH of HTN and HLD presented to the ED with complaints of generalized weakness, nausea and vomiting 2 weeks but no abdominal pain. CT abdomen and pelvis showed 3.5 cm pancreatic tail lesion as well as multiple lesions in the liver concerning for metastatic disease and question of DVT involving left common femoral vein. Patient status post ultrasound-guided liver biopsy on 8/6, pathology pending.   Assessment & Plan:   Principal Problem:   Acute kidney injury ([place]) Active Problems:   Hypertension   High cholesterol   Dehydration   Nausea & vomiting   Pancreatic mass   Liver lesions   ? DVT (deep venous thrombosis) Common Femoral vein   UTI (urinary tract infection)   Liver metastases ([place])   1. Pancreatic tail & liver lesions, metastatic pancreatic cancer suspected. [facility] input appreciated and signed off 8/7. CA 19-9: 75K. Status post liver mass biopsy by IR on 11/12/16> discussed with pathologist on 8/9, preliminary report: Carcinoma, pleomorphic and eosinophilic cells seen. Need a couple more markers to come back but suspects adenocarcinoma, possibly pancreatic origin. CT chest shows no obvious pulmonary metastatic lesions. Oncology consulted. Discussed with Dr. Sonny Dandy who recommends hospice and no aggressive treatment. Awaiting to update and discussed with family. Palliative care consultation/hospice. 2. Left common femoral vein DVT: Currently on IV heparin drip. As per Oncology input, if transitions to hospice then no anticoagulation. Continue IV heparin until discussion with family. 3. Escherichia coli Acute lower UTI: Initially treated with IV Rocephin and then transitioned to oral Omnicef. Completed 5 days antibiotics, asymptomatic, discontinued  8/9 4. Essential hypertension: Controlled. Holding preadmission Benicar. 5. Hyperlipidemia: On Crestor. 6. Acute kidney injury: Likely related to poor oral intake and GI losses. Held ARB. Hydrated with IV fluids. Acute kidney injury resolved. 7. Anemia: Likely related to chronic disease and malignancy. Stable. 8. Non anion gap metabolic acidosis: Unclear etiology.? RTA. Mild hyperchloremia probably not the full cause. Follow BMP in a.m. May consider starting sodium bicarbonate tablets. Better. 9. CAD: Seen on CTA chest. Asymptomatic. 10. Right lower lobe consolidation: Seen on CT chest but no symptoms suggestive of pneumonia.    DVT prophylaxis: Currently on IV heparin drip Code Status: Full Family Communication: None at bedside. Disposition: To be determined   Consultants:  [facility] Interventional radiology  Medical oncology.  Procedures:  Status post ultrasound guided liver lesion: Biopsy 11/12/16  Antimicrobials:  IV Rocephin, discontinued Cefdinir , discontinued.   Subjective: States that she had a BM last night. Also reports an episode of nonbloody emesis yesterday. Poor appetite. No pain reported.  ROS: Denies pain elsewhere, dyspnea or chest pain.  Objective:  Vitals:   11/14/16 1700 11/14/16 2145 11/15/16 0509 11/15/16 0921  BP: (!) 125/58 118/87 (!) 108/59 123/65  Pulse: 81 84 79 82  Resp: 18 18 18 18   Temp: 98 F (36.7 C) 98.7 F (37.1 C) (!) 97.4 F (36.3 C) 98.1 F (36.7 C)  TempSrc: Oral Oral Oral Oral  SpO2: 100% 99% 99% 99%  Weight:  48.1 kg (106 lb)    Height:        Examination:  General exam: Pleasant elderly female, small built, frail and chronically ill looking, lying comfortably supine in bed.Seen eating breakfast this morning and had  barely eaten a few bites. Respiratory system: Clear to auscultation. Respiratory effort normal. Cardiovascular system: S1 & S2 heard, RRR. No JVD, murmurs, rubs, gallops or clicks. No pedal edema.   Gastrointestinal system: Abdomen is nondistended, soft and nontender. No organomegaly or masses felt. Normal bowel sounds heard. Central nervous system: Alert and oriented. No focal neurological deficits. Extremities: Symmetric 5 x 5 power. Skin: No rashes, lesions or ulcers Psychiatry: Judgement and insight appear normal. Mood & affect flat.     Data Reviewed: I have personally reviewed following labs and imaging studies  CBC:  Recent Labs Lab 11/11/16 1219 11/12/16 0136 11/13/16 0827 11/14/16 0441 11/15/16 0416  WBC 15.9* 15.2* 16.5* 14.8* 14.1*  NEUTROABS  --  11.8*  --   --   --   HGB 12.1 9.6* 9.4* 9.1* 8.9*  HCT 37.5 28.3* 29.1* 27.7* 26.7*  MCV 85.0 82.3 83.4 85.0 83.7  PLT 267 188 193 186 263   Basic Metabolic Panel:  Recent Labs Lab 11/11/16 1219 11/11/16 1908 11/12/16 0136 11/14/16 0441 11/15/16 0416  NA 137  --  137 139 138  K 5.2*  --  4.5 4.1 3.5  CL 101  --  108 113* 111  CO2 18*  --  19* 17* 19*  GLUCOSE 123*  --  103* 92 98  BUN 56*  --  41* 18 13  CREATININE 1.50*  --  1.13* 0.83 0.87  CALCIUM 9.8  --  8.4* 8.0* 7.9*  MG  --  2.3  --   --   --    Liver Function Tests:  Recent Labs Lab 11/11/16 1219 11/12/16 0136 11/14/16 0441  AST 26 17 16   ALT 16 12* 11*  ALKPHOS 322* 210* 162*  BILITOT 1.2 1.0 0.7  PROT 7.4 5.0* 4.5*  ALBUMIN 2.9* 2.1* 1.7*   Coagulation Profile:  Recent Labs Lab 11/12/16 0705  INR 1.32   Cardiac Enzymes: No results for input(s): CKTOTAL, CKMB, CKMBINDEX, TROPONINI in the last 168 hours. HbA1C: No results for input(s): HGBA1C in the last 72 hours. CBG: No results for input(s): GLUCAP in the last 168 hours.  Recent Results (from the past 240 hour(s))  Culture, Urine     Status: Abnormal   Collection Time: 11/11/16  3:20 PM  Result Value Ref Range Status   Specimen Description URINE, CATHETERIZED  Final   Special Requests NONE  Final   Culture >=100,000 COLONIES/mL ESCHERICHIA COLI (A)  Final   Report  Status 11/13/2016 FINAL  Final   Organism ID, Bacteria ESCHERICHIA COLI (A)  Final      Susceptibility   Escherichia coli - MIC*    AMPICILLIN 8 SENSITIVE Sensitive     CEFAZOLIN <=4 SENSITIVE Sensitive     CEFTRIAXONE <=1 SENSITIVE Sensitive     CIPROFLOXACIN <=0.25 SENSITIVE Sensitive     GENTAMICIN <=1 SENSITIVE Sensitive     IMIPENEM <=0.25 SENSITIVE Sensitive     NITROFURANTOIN <=16 SENSITIVE Sensitive     TRIMETH/SULFA <=20 SENSITIVE Sensitive     AMPICILLIN/SULBACTAM <=2 SENSITIVE Sensitive     PIP/TAZO <=4 SENSITIVE Sensitive     Extended ESBL NEGATIVE Sensitive     * >=100,000 COLONIES/mL ESCHERICHIA COLI  Culture, blood (Routine X 2) w Reflex to ID Panel     Status: None (Preliminary result)   Collection Time: 11/11/16  6:19 PM  Result Value Ref Range Status   Specimen Description BLOOD RIGHT HAND  Final   Special Requests   Final  BOTTLES DRAWN AEROBIC ONLY Blood Culture adequate volume   Culture NO GROWTH 3 DAYS  Final   Report Status PENDING  Incomplete  Culture, blood (Routine X 2) w Reflex to ID Panel     Status: None (Preliminary result)   Collection Time: 11/11/16  6:26 PM  Result Value Ref Range Status   Specimen Description BLOOD LEFT HAND  Final   Special Requests IN PEDIATRIC BOTTLE Blood Culture adequate volume  Final   Culture NO GROWTH 3 DAYS  Final   Report Status PENDING  Incomplete         Radiology Studies: Ct Chest Wo Contrast  Result Date: 11/13/2016 CLINICAL DATA:  CT of the abdomen and pelvis demonstrating mass of the tail of the pancreas and widespread hepatic metastatic disease. Imaged lung bases showed a 4 mm inferior lingular nodule and bandlike area of opacity at the base of the left lower lobe. Status post liver lesion biopsy. EXAM: CT CHEST WITHOUT CONTRAST TECHNIQUE: Multidetector CT imaging of the chest was performed following the standard protocol without IV contrast. COMPARISON:  CT of the abdomen on 11/11/2016 FINDINGS:  Cardiovascular: Normal heart size. No pericardial effusion. Scattered calcified plaque is noted in the distribution of the LAD and left circumflex coronary arteries. Scattered atherosclerotic plaque in the thoracic aorta without evidence of aneurysmal disease. Mediastinum/Nodes: No obvious mediastinal masses. No overt lymphadenopathy identified. Lungs/Pleura: Since the prior abdominal CT, there is a new small right pleural effusion and dense atelectasis/ consolidation involving the posterior aspect of the right lower lobe. Although this may be on the basis of atelectasis, correlation suggested with any clinical evidence to suggest pneumonia or aspiration. There is a very small left pleural effusion. No evidence to suggest obvious pulmonary metastatic disease. Scarring is present at the left lung base and there is a stable small inferior lingular nodule measuring approximately 4 mm. Upper Abdomen: Visible metastatic disease of the liver and free fluid adjacent to the liver. Musculoskeletal: No evidence of bone metastases or fractures. Diffuse degenerative disc disease is present throughout the thoracic spine. IMPRESSION: 1. Coronary atherosclerosis with scattered calcified plaque in the distribution of the LAD and left circumflex coronary arteries. The thoracic aorta also demonstrates atherosclerosis without aneurysmal disease. 2. New small right pleural effusion and dense consolidation of the posterior right lower lobe since the prior abdominal CT 2 days ago. This raises the possibility of pneumonia or aspiration. There is a small left pleural effusion. 3. Stable isolated 4 mm nodule in the inferior lingula. No other discrete pulmonary nodules are identified to suggest obvious pulmonary metastatic disease. Aortic Atherosclerosis (ICD10-I70.0). Electronically Signed   By: Aletta Edouard M.D.   On: 11/13/2016 22:17        Scheduled Meds: . cefdinir  300 mg Oral Daily  . latanoprost  1 drop Both Eyes QHS  .  pantoprazole  40 mg Oral Daily  . rosuvastatin  20 mg Oral QHS  . sodium chloride flush  3 mL Intravenous Q12H  . timolol  1 drop Both Eyes Daily  . [START ON 11/16/2016] Vitamin D (Ergocalciferol)  50,000 Units Oral Q7 days   Continuous Infusions: . heparin 800 Units/hr (11/13/16 0724)     LOS: 4 days     Sugar Vanzandt, MD, FACP, FHM. [facility] Pager [phone]  If 7PM-7AM, please contact night-coverage [email or web address] Password [email or web address] 11/15/2016, 4:13 PM

## 2016-11-15 NOTE — Progress Notes (Signed)
PT Cancellation Note  Patient Details Name: Alexandra Mcmahon MRN: [id number] DOB: 04-Dec-1933   Cancelled Treatment:    Reason Eval/Treat Not Completed: Other (comment).  Pt was not feeling well, stomach upset x 2 attempts.  Talked with her nurse to see if she can assist with this, and will try again tomorrow.   Ramond Dial 11/15/2016, 1:47 PM   Mee Hives, PT MS Acute Rehab Dept. Number: [place]

## 2016-11-15 NOTE — Consult Note (Signed)
[place] NOTE  Patient Care Team: Vincente Liberty, MD as PCP - General (Pulmonary Disease)  CHIEF COMPLAINTS/PURPOSE OF CONSULTATION:  Clinical suspicion for metastatic pancreatic cancer  HISTORY OF PRESENTING ILLNESS:  Alexandra Mcmahon 83 y.o. female was been admitted to the hospital with abdominal pain nausea and vomiting. CT of the abdomen was performed which revealed pancreatic tail lesion with multiple metastatic lesions in the liver. She was also diagnosed with DVT of the left common femoral vein. Prior to this hospitalization, patient has been fairly healthy and was able to take care of herself. This history was obtained from the patient. There is no family today at the bedside. She reports the pain has improved. Denies any further nausea. She denies any neck pain or swelling   MEDICAL HISTORY:  Past Medical History:  Diagnosis Date  . High cholesterol   . Hypertension     SURGICAL HISTORY: History reviewed. No pertinent surgical history.  SOCIAL HISTORY: Social History   Social History  . Marital status: Single    Spouse name: N/A  . Number of children: N/A  . Years of education: N/A   Occupational History  . Not on file.   Social History Main Topics  . Smoking status: Never Smoker  . Smokeless tobacco: Never Used  . Alcohol use No  . Drug use: No  . Sexual activity: Not on file   Other Topics Concern  . Not on file   Social History Narrative  . No narrative on file    FAMILY HISTORY: Family History  Problem Relation Age of Onset  . Breast cancer Neg Hx     ALLERGIES:  has No Known Allergies.  MEDICATIONS:  Current Facility-Administered Medications  Medication Dose Route Frequency Provider Last Rate Last Dose  . acetaminophen (TYLENOL) tablet 650 mg  650 mg Oral Q6H PRN Samella Parr, NP       Or  . acetaminophen (TYLENOL) suppository 650 mg  650 mg Rectal Q6H PRN Samella Parr, NP      . cefdinir (OMNICEF) 125 MG/5ML  suspension 300 mg  300 mg Oral Daily Reyne Dumas, MD   300 mg at 11/15/16 1114  . heparin ADULT infusion 100 units/mL (25000 units/2104mL sodium chloride 0.45%)  800 Units/hr Intravenous Continuous Reyne Dumas, MD 8 mL/hr at 11/13/16 0724 800 Units/hr at 11/13/16 0724  . latanoprost (XALATAN) 0.005 % ophthalmic solution 1 drop  1 drop Both Eyes QHS Reyne Dumas, MD   1 drop at 11/14/16 2314  . ondansetron (ZOFRAN) injection 4 mg  4 mg Intravenous Q6H PRN Samella Parr, NP   4 mg at 11/14/16 1525   Or  . promethazine (PHENERGAN) injection 6.25 mg  6.25 mg Intravenous Q6H PRN Samella Parr, NP      . pantoprazole (PROTONIX) EC tablet 40 mg  40 mg Oral Daily Reyne Dumas, MD   40 mg at 11/15/16 1114  . rosuvastatin (CRESTOR) tablet 20 mg  20 mg Oral QHS Reyne Dumas, MD   20 mg at 11/14/16 2313  . sodium chloride flush (NS) 0.9 % injection 3 mL  3 mL Intravenous Q12H Samella Parr, NP   3 mL at 11/14/16 2314  . timolol (TIMOPTIC) 0.5 % ophthalmic solution 1 drop  1 drop Both Eyes Daily Reyne Dumas, MD   1 drop at 11/15/16 1114  . [START ON 11/16/2016] Vitamin D (Ergocalciferol) (DRISDOL) capsule 50,000 Units  50,000 Units Oral Q7 days Reyne Dumas, MD  REVIEW OF SYSTEMS: Elderly frail lady in no acute distress  Constitutional: Denies fevers, chills or abnormal night sweats Eyes: Denies blurriness of vision, double vision or watery eyes Ears, nose, mouth, throat, and face: Denies mucositis or sore throat Respiratory: Denies cough, dyspnea or wheezes Cardiovascular: Denies palpitation, chest discomfort or lower extremity swelling Gastrointestinal:  On admission patient had abdominal pain nausea vomiting Skin: Denies abnormal skin rashes Lymphatics: Denies new lymphadenopathy or easy bruising Neurological:Denies numbness, tingling or new weaknesses, patient able to converse appropriately. Behavioral/Psych: Mood is stable, no new changes   All other systems were reviewed with  the patient and are negative.  PHYSICAL EXAMINATION: ECOG PERFORMANCE STATUS: 3 - Symptomatic, >50% confined to bed  Vitals:   11/15/16 0509 11/15/16 0921  BP: (!) 108/59 123/65  Pulse: 79 82  Resp: 18 18  Temp: (!) 97.4 F (36.3 C) 98.1 F (36.7 C)  SpO2: 99% 99%   Filed Weights   11/12/16 2141 11/13/16 2058 11/14/16 2145  Weight: 104 lb 0.9 oz (47.2 kg) 105 lb (47.6 kg) 106 lb (48.1 kg)    GENERAL:Frail elderly lady SKIN: skin color, texture, turgor are normal, no rashes or significant lesions EYES: normal, conjunctiva are pink and non-injected, sclera clear OROPHARYNX:no exudate, no erythema and lips, buccal mucosa, and tongue normal  NECK: supple, thyroid normal size, non-tender, without nodularity LUNGS: clear to auscultation and percussion with normal breathing effort HEART: regular rate & rhythm and no murmurs and no lower extremity edema ABDOMEN:abdomen soft, non-tender and normal bowel sounds Musculoskeletal:no cyanosis of digits and no clubbing  PSYCH: alert & oriented x 3 with fluent speech NEURO: no focal motor/sensory deficits  LABORATORY DATA:  I have reviewed the data as listed Lab Results  Component Value Date   WBC 14.1 (H) 11/15/2016   HGB 8.9 (L) 11/15/2016   HCT 26.7 (L) 11/15/2016   MCV 83.7 11/15/2016   PLT 180 11/15/2016   Lab Results  Component Value Date   NA 138 11/15/2016   K 3.5 11/15/2016   CL 111 11/15/2016   CO2 19 (L) 11/15/2016    RADIOGRAPHIC STUDIES: I have personally reviewed the radiological reports and agreed with the findings in the report.  ASSESSMENT AND PLAN:  1. Probable metastatic pancreatic cancer with widespread liver metastases: Patient is elderly frail and has poor performance status. Pathology on the biopsy is likely to come back in the next day or 2. Based on the clinical features suggestive of metastatic pancreatic cancer. CT and 19-9 is almost 75,000. I discussed with the patient that metastatic pancreatic  cancer cannot be cured that chemotherapy could prolong her life. However given her advanced age and poor performance status, I did not recommend systemic chemotherapy. I recommended the patient to pursue hospice care.  2. Left common femoral vein DVT: Patient is asymptomatic without any leg swelling. Given the fact that her goal is comfort, she may not need anticoagulation.  Given the extent of the disease, I suspect that she has a very poor prognosis. Our goal should be to keep her comfortable. I recommended hospice.  I called the patient's son and daughter in law and the phones rang and were not answered. It kept ringing so I couldn't leave a message either.  All questions were answered. The patient knows to call the clinic with any problems, questions or concerns.    Rulon Eisenmenger, MD @T @

## 2016-11-16 DIAGNOSIS — C259 Malignant neoplasm of pancreas, unspecified: Secondary | ICD-10-CM

## 2016-11-16 DIAGNOSIS — Z515 Encounter for palliative care: Secondary | ICD-10-CM

## 2016-11-16 DIAGNOSIS — Z7189 Other specified counseling: Secondary | ICD-10-CM

## 2016-11-16 DIAGNOSIS — C799 Secondary malignant neoplasm of unspecified site: Secondary | ICD-10-CM

## 2016-11-16 LAB — CBC
HCT: 26.7 % — ABNORMAL LOW (ref 36.0–46.0)
HEMOGLOBIN: 8.8 g/dL — AB (ref 12.0–15.0)
MCH: 27 pg (ref 26.0–34.0)
MCHC: 33 g/dL (ref 30.0–36.0)
MCV: 81.9 fL (ref 78.0–100.0)
Platelets: 168 10*3/uL (ref 150–400)
RBC: 3.26 MIL/uL — AB (ref 3.87–5.11)
RDW: 16.4 % — ABNORMAL HIGH (ref 11.5–15.5)
WBC: 15.4 10*3/uL — ABNORMAL HIGH (ref 4.0–10.5)

## 2016-11-16 LAB — BASIC METABOLIC PANEL
Anion gap: 7 (ref 5–15)
BUN: 11 mg/dL (ref 6–20)
CHLORIDE: 108 mmol/L (ref 101–111)
CO2: 19 mmol/L — AB (ref 22–32)
Calcium: 8.1 mg/dL — ABNORMAL LOW (ref 8.9–10.3)
Creatinine, Ser: 0.88 mg/dL (ref 0.44–1.00)
GFR calc Af Amer: >60 mL/min (ref >60)
GFR calc non Af Amer: >60 mL/min (ref >60)
GLUCOSE: 95 mg/dL (ref 65–99)
POTASSIUM: 3.6 mmol/L (ref 3.5–5.1)
Sodium: 134 mmol/L — ABNORMAL LOW (ref 135–145)

## 2016-11-16 LAB — CULTURE, BLOOD (ROUTINE X 2)
CULTURE: NO GROWTH
CULTURE: NO GROWTH
SPECIAL REQUESTS: ADEQUATE
Special Requests: ADEQUATE

## 2016-11-16 LAB — HEPARIN LEVEL (UNFRACTIONATED): Heparin Unfractionated: 0.68 IU/mL (ref 0.30–0.70)

## 2016-11-16 MED ORDER — RIVAROXABAN (XARELTO) VTE STARTER PACK (15 & 20 MG): ORAL_TABLET | ORAL | 0 refills | Status: DC

## 2016-11-16 MED ORDER — LORAZEPAM 2 MG/ML PO CONC: 0.2500 mg | Freq: Four times a day (QID) | ORAL | Status: DC | PRN

## 2016-11-16 MED ORDER — RIVAROXABAN 15 MG PO TABS
15.0000 mg | ORAL_TABLET | Freq: Two times a day (BID) | ORAL | Status: DC
  Administered 2016-11-16 – 2016-11-17 (×2): 15 mg via ORAL
  Filled 2016-11-16 (×2): qty 1

## 2016-11-16 MED ORDER — MORPHINE SULFATE (CONCENTRATE) 10 MG/0.5ML PO SOLN
5.0000 mg | ORAL | 0 refills | Status: AC | PRN
Start: 2016-11-16 — End: ?

## 2016-11-16 MED ORDER — MORPHINE SULFATE (CONCENTRATE) 10 MG/0.5ML PO SOLN: 5.0000 mg | ORAL | Status: DC | PRN

## 2016-11-16 NOTE — Discharge Summary (Signed)
Physician Discharge Summary  Alexandra Mcmahon CWC:[id number] DOB: 1933/10/06  PCP: Vincente Liberty, MD  Admit date: 11/11/2016 Discharge date: 11/16/2016  Recommendations for Outpatient Follow-up:  1. M[facility]D. at [facility], for ongoing hospice care. 2. Dr. Vincente Liberty, PCP as needed  Home Health: None Equipment/Devices: None    Discharge Condition: Guarded and at risk for rapid decline due to metastatic pancreatic cancer.  CODE STATUS: DO NOT RESUSCITATE  Diet recommendation: Regular diet or diet of choice.  Discharge Diagnoses:  Principal Problem:   Acute kidney injury ([place]) Active Problems:   Hypertension   High cholesterol   Dehydration   Nausea & vomiting   Pancreatic mass   Liver lesions   ? DVT (deep venous thrombosis) Common Femoral vein   UTI (urinary tract infection)   Liver metastases (HCC)   Metastatic cancer [facility])   Palliative care encounter   Goals of care, counseling/discussion   Encounter for hospice care discussion   Brief Summary: 83 year old female with PMH of HTN and HLD presented to the ED with complaints of generalized weakness, nausea and vomiting 2 weeks but no abdominal pain. CT abdomen and pelvis showed 3.5 cm pancreatic tail lesion as well as multiple lesions in the liver concerning for metastatic disease and question of DVT involving left common femoral vein. Liver biopsy confirmed metastatic pancreatic cancer. Oncology consulted and recommended hospice. Patient and family agreeable. They however wish to continue anticoagulation with Xarelto for at least a month. Palliative care consulted for assisting with hospice and placement.  Assessment & Plan:   1. Metastatic pancreatic cancer with widespread liver metastasis: [facility] was consulted and patient underwent core liver mass biopsy by IR. CA 19-9: 75K. Biopsy confirms metastatic poorly differentiated adenocarcinoma favoring pancreatic cancer. Oncology was consulted and recommended hospice  care. Patient and family are agreeable and patient will be going to hospice home at [facility] for EOL care. Continue oral morphine as needed for pain or anxiety. May consider adding Ativan when necessary for anxiety at hospice house. 2. Left common femoral vein DVT: Patient was initially placed on IV heparin drip. She is asymptomatic without any leg swelling or pain. As per oncology, given that her goal is comfort, recommended no anticoagulation. However as per palliative care team meeting with patient/family and they're input, patient would like to continue oral anticoagulation at least for a month for comfort (preventing the current clot from getting bigger). As discussed with oncology, transitioned to oral Xarelto and case management provided patient with a discount card. 3. Escherichia coli Acute lower UTI: Initially treated with IV Rocephin and then transitioned to oral Omnicef. Completed 5 days antibiotics, asymptomatic, discontinued 8/9 4. Essential hypertension: Controlled. Discontinued Benicar. 5. Hyperlipidemia:  discontinued Crestor. 6. Acute kidney injury: Likely related to poor oral intake and GI losses. Held ARB. Hydrated with IV fluids. Acute kidney injury resolved. 7. Anemia: Likely related to chronic disease and malignancy.  8. Non anion gap metabolic acidosis: Unclear etiology.? RTA. Mild hyperchloremia probably not the full cause. Follow BMP in a.m. May consider starting sodium bicarbonate tablets. Better. 9. CAD: Seen on CTA chest. Asymptomatic. 10. Right lower lobe consolidation: Seen on CT chest but no symptoms suggestive of pneumonia.     Consultants:  [facility] Interventional radiology  Medical oncology. Palliative care medicine.  Procedures:  Status post ultrasound guided liver lesion: Biopsy 11/12/16  Pathology results: Diagnosis Liver, needle/core biopsy, Right METASTATIC POORLY DIFFERENTIATED ADENOCARCINOMA Microscopic Comment The neoplastic cells has  oncocytic pleomorphic morphology. Immunostains shows positive  for ck7, mucicarmin, pax8, negative for cdx2, ck20, arginase-1, synaptophysin, chromogranin, TTF-1, GATA3, melanin A, cd31 and CD34. The immunostaining pattern supports theses cells are metastatic poorly differentiated adenocarcinoma. favor pancreatic primary.  Discharge Instructions  Discharge Instructions    Call MD for:  difficulty breathing, headache or visual disturbances    Complete by:  As directed    Call MD for:  extreme fatigue    Complete by:  As directed    Call MD for:  persistant dizziness or light-headedness    Complete by:  As directed    Call MD for:  persistant nausea and vomiting    Complete by:  As directed    Call MD for:  severe uncontrolled pain    Complete by:  As directed    Call MD for:  temperature >100.4    Complete by:  As directed    Diet general    Complete by:  As directed    Increase activity slowly    Complete by:  As directed        Medication List    STOP taking these medications   ibuprofen 400 MG tablet Commonly known as:  ADVIL,MOTRIN   olmesartan 20 MG tablet Commonly known as:  BENICAR   rosuvastatin 20 MG tablet Commonly known as:  CRESTOR   Vitamin D (Ergocalciferol) 50000 units Caps capsule Commonly known as:  DRISDOL     TAKE these medications   morphine CONCENTRATE 10 MG/0.5ML Soln concentrated solution Take 0.25 mLs (5 mg total) by mouth every 4 (four) hours as needed for moderate pain, severe pain, anxiety or shortness of breath.   Rivaroxaban 15 & 20 MG Tbpk Commonly known as:  XARELTO STARTER PACK Take as directed on package: Start with one 15mg  tablet by mouth twice a day with food. On Day 22, switch to one 20mg  tablet once a day with food.  Pharmacy: Please discard the first tablet from the pack which was given in the hospital.   timolol 0.5 % ophthalmic solution Commonly known as:  TIMOPTIC Place 1 drop into both eyes daily.   Travoprost (BAK Free)  0.004 % Soln ophthalmic solution Commonly known as:  TRAVATAN Place 1 drop into both eyes at bedtime.      Follow-up Information    Vincente Liberty, MD Follow up.   Specialty:  Pulmonary Disease Why:  As needed. Contact information: [place] [address] [phone]        MD at [facility] Follow up.   Why:  for ongoing hospice care.         No Known Allergies    Procedures/Studies: Ct Chest Wo Contrast  Result Date: 11/13/2016 CLINICAL DATA:  CT of the abdomen and pelvis demonstrating mass of the tail of the pancreas and widespread hepatic metastatic disease. Imaged lung bases showed a 4 mm inferior lingular nodule and bandlike area of opacity at the base of the left lower lobe. Status post liver lesion biopsy. EXAM: CT CHEST WITHOUT CONTRAST TECHNIQUE: Multidetector CT imaging of the chest was performed following the standard protocol without IV contrast. COMPARISON:  CT of the abdomen on 11/11/2016 FINDINGS: Cardiovascular: Normal heart size. No pericardial effusion. Scattered calcified plaque is noted in the distribution of the LAD and left circumflex coronary arteries. Scattered atherosclerotic plaque in the thoracic aorta without evidence of aneurysmal disease. Mediastinum/Nodes: No obvious mediastinal masses. No overt lymphadenopathy identified. Lungs/Pleura: Since the prior abdominal CT, there is a new small right pleural effusion  and dense atelectasis/ consolidation involving the posterior aspect of the right lower lobe. Although this may be on the basis of atelectasis, correlation suggested with any clinical evidence to suggest pneumonia or aspiration. There is a very small left pleural effusion. No evidence to suggest obvious pulmonary metastatic disease. Scarring is present at the left lung base and there is a stable small inferior lingular nodule measuring approximately 4 mm. Upper Abdomen: Visible metastatic disease of the liver and free  fluid adjacent to the liver. Musculoskeletal: No evidence of bone metastases or fractures. Diffuse degenerative disc disease is present throughout the thoracic spine. IMPRESSION: 1. Coronary atherosclerosis with scattered calcified plaque in the distribution of the LAD and left circumflex coronary arteries. The thoracic aorta also demonstrates atherosclerosis without aneurysmal disease. 2. New small right pleural effusion and dense consolidation of the posterior right lower lobe since the prior abdominal CT 2 days ago. This raises the possibility of pneumonia or aspiration. There is a small left pleural effusion. 3. Stable isolated 4 mm nodule in the inferior lingula. No other discrete pulmonary nodules are identified to suggest obvious pulmonary metastatic disease. Aortic Atherosclerosis (ICD10-I70.0). Electronically Signed   By: Aletta Edouard M.D.   On: 11/13/2016 22:17   Ct Abdomen Pelvis W Contrast  Result Date: 11/11/2016 CLINICAL DATA:  83 year old with nausea and vomiting. Symptoms started 2 weeks ago. Abdominal pain. EXAM: CT ABDOMEN AND PELVIS WITH CONTRAST TECHNIQUE: Multidetector CT imaging of the abdomen and pelvis was performed using the standard protocol following bolus administration of intravenous contrast. CONTRAST:  54mL ISOVUE-300 IOPAMIDOL (ISOVUE-300) INJECTION 61% COMPARISON:  None. FINDINGS: Lower chest: 4 mm nodule in the lingula on sequence 5, image 16. Bandlike density in the left lower lobe on sequence 5, image 15 measures 5 mm in width but the length is roughly 21 mm. No large pleural effusions. Hepatobiliary: Innumerable low-density hepatic lesions throughout both lobes of the liver. Index lesion in the right hepatic lobe measuring 5.5 x 3.4 cm on sequence 3, image 18. Second index lesion is in the inferior right hepatic lobe measuring 6.1 x 5.1 cm on image 38. Small amount of perihepatic edema and ascites. Main portal venous system appears to be patent. Liver has a nodular contour.  There is mild intrahepatic biliary dilatation in the anterior right hepatic lobe on sequence 3, image 15. Small amount of fluid around the gallbladder. No gross abnormality to the gallbladder. No significant extrahepatic biliary dilatation. Pancreas: Lobulated low-density lesion at the pancreatic tail measuring 3.5 x 2.5 cm on sequence 3, image 18. Findings are suggestive for a primary pancreatic neoplasm. No significant pancreatic duct dilatation. Spleen: Normal appearance of the spleen was minimal perisplenic ascites. Adrenals/Urinary Tract: Normal adrenal glands. Small cyst in the right kidney upper pole. No suspicious renal lesions. Negative for hydronephrosis. Wall thickening along the right side of the urinary bladder. No definite renal or urinary stones. Stomach/Bowel: No significant bowel dilatation. No gross abnormality to the stomach or duodenum. The appendix appears to be normal. Mild asymmetric wall thickening in the ascending colon on sequence 3, image 41 is nonspecific and could be related to incomplete distension. Normal appearance of the terminal ileum. Vascular/Lymphatic: Atherosclerotic calcifications in the aorta and iliac arteries without aortic aneurysm. SMA is small but appears to be patent. Celiac trunk is patent. IMA appears to be patent. The infrarenal IVC is compressed to due to the enlarged right hepatic lobe. Enlarged lymph node at porta hepatis measuring 1.4 cm in the short axis on sequence  6 image 21. Mild expansion of the left common femoral vein and concern for a filling defect in left common femoral vein. Reproductive: Multiple calcifications in the uterus. No suspicious adnexal lesions. Other: Small amount of free fluid in the pelvis. Negative for free air. Musculoskeletal: Levoscoliosis of the thoracolumbar spine. Severe degenerative disc disease at T12-L1. Severe joint space loss and degenerative changes in the right hip joint. Subtle areas of lucency in the pelvic bones probably  related to osteoporosis. IMPRESSION: 3.5 cm pancreatic lesion with innumerable liver lesions. Findings are most compatible with metastatic pancreatic cancer. Liver lesions would be amenable to percutaneous biopsy. Concern for a DVT in the left common femoral vein. Recommend a lower extremity venous duplex examination. Small amount of abdominal ascites. Mild lymphadenopathy in the upper abdomen. Mild wall thickening in the ascending colon is nonspecific. Neoplastic process in the right colon is thought to be less likely but cannot be completely excluded. Indeterminate nodules and densities at the left lung base. Recommend follow-up chest CT to evaluate for metastatic chest disease. Wall thickening along the right side of the urinary bladder. Findings could be related to cystitis but indeterminate. Consider non emergent Urology consultation. Severe degenerative disease in the right hip. Scoliosis with multilevel degenerative spine disease. These results were called by telephone at the time of interpretation on 11/11/2016 at 2:54 pm to Dr. Carmin Muskrat , who verbally acknowledged these results. Electronically Signed   By: Markus Daft M.D.   On: 11/11/2016 15:05   US Biopsy  Result Date: 11/13/2016 CLINICAL DATA:  pancreatic mass. Multiple liver lesions suggesting metastatic disease. Biopsy requested EXAM: ULTRASOUND GUIDED CORE BIOPSY OF LIVER LESION MEDICATIONS: Intravenous Fentanyl and Versed were administered as conscious sedation during continuous monitoring of the patient's level of consciousness and physiological / cardiorespiratory status by the radiology RN, with a total moderate sedation time of 10 minutes. PROCEDURE: The procedure, risks, benefits, and alternatives were explained to the patient. Questions regarding the procedure were encouraged and answered. The patient understands and consents to the procedure. Survey ultrasound of the liver was performed. A representative lesion in the right lobe was  localized. An appropriate skin entry site was determined and marked. The operative field was prepped with chlorhexidine in a sterile fashion, and a sterile drape was applied covering the operative field. A sterile gown and sterile gloves were used for the procedure. Local anesthesia was provided with 1% Lidocaine. Under real-time ultrasound guidance, a 17 gauge trocar needle was advanced to the margin of the lesion. Once needle tip position was confirmed, coaxial 18-gauge core biopsy samples were obtained, submitted in formalin to surgical pathology. The guide needle was removed. Postprocedure scans show no hemorrhage or other apparent complication. The patient tolerated the procedure well. COMPLICATIONS: None. FINDINGS: Multiple liver lesions were identified corresponding to recent CT findings. Core biopsy of a representative right lobe lesion obtained. IMPRESSION: 1. Technically successful ultrasound-guided liver lesion core biopsy. Electronically Signed   By: Lucrezia Europe M.D.   On: 11/13/2016 08:48      Subjective: Seen this morning. States that she ate all her breakfast. No pain, nausea, vomiting, dyspnea or chest pain reported. Having BMs.  Discharge Exam:  Vitals:   11/15/16 1700 11/15/16 2049 11/16/16 0505 11/16/16 0900  BP: (!) 119/91 123/87 127/82 135/78  Pulse: 78 80 72 80  Resp: 18 19 20 18   Temp: 98.5 F (36.9 C) 98.3 F (36.8 C) 98.2 F (36.8 C) 98 F (36.7 C)  TempSrc: Oral Oral Oral  Oral  SpO2: 99% 100% 100% 100%  Weight:  48.2 kg (106 lb 4.2 oz)    Height:        General exam: Pleasant elderly female, small built, frail and chronically ill looking, lying comfortably supine in bed. Respiratory system: Clear to auscultation. Respiratory effort normal. Cardiovascular system: S1 & S2 heard, RRR. No JVD, murmurs, rubs, gallops or clicks. No pedal edema.  Gastrointestinal system: Abdomen is nondistended, soft and nontender. No organomegaly or masses felt. Normal bowel sounds  heard. Central nervous system: Alert and oriented. No focal neurological deficits. Extremities: Symmetric 5 x 5 power. Skin: No rashes, lesions or ulcers Psychiatry: Judgement and insight appear normal. Mood & affect flat.      The results of significant diagnostics from this hospitalization (including imaging, microbiology, ancillary and laboratory) are listed below for reference.     Microbiology: Recent Results (from the past 240 hour(s))  Culture, Urine     Status: Abnormal   Collection Time: 11/11/16  3:20 PM  Result Value Ref Range Status   Specimen Description URINE, CATHETERIZED  Final   Special Requests NONE  Final   Culture >=100,000 COLONIES/mL ESCHERICHIA COLI (A)  Final   Report Status 11/13/2016 FINAL  Final   Organism ID, Bacteria ESCHERICHIA COLI (A)  Final      Susceptibility   Escherichia coli - MIC*    AMPICILLIN 8 SENSITIVE Sensitive     CEFAZOLIN <=4 SENSITIVE Sensitive     CEFTRIAXONE <=1 SENSITIVE Sensitive     CIPROFLOXACIN <=0.25 SENSITIVE Sensitive     GENTAMICIN <=1 SENSITIVE Sensitive     IMIPENEM <=0.25 SENSITIVE Sensitive     NITROFURANTOIN <=16 SENSITIVE Sensitive     TRIMETH/SULFA <=20 SENSITIVE Sensitive     AMPICILLIN/SULBACTAM <=2 SENSITIVE Sensitive     PIP/TAZO <=4 SENSITIVE Sensitive     Extended ESBL NEGATIVE Sensitive     * >=100,000 COLONIES/mL ESCHERICHIA COLI  Culture, blood (Routine X 2) w Reflex to ID Panel     Status: None   Collection Time: 11/11/16  6:19 PM  Result Value Ref Range Status   Specimen Description BLOOD RIGHT HAND  Final   Special Requests   Final    BOTTLES DRAWN AEROBIC ONLY Blood Culture adequate volume   Culture NO GROWTH 5 DAYS  Final   Report Status 11/16/2016 FINAL  Final  Culture, blood (Routine X 2) w Reflex to ID Panel     Status: None   Collection Time: 11/11/16  6:26 PM  Result Value Ref Range Status   Specimen Description BLOOD LEFT HAND  Final   Special Requests IN PEDIATRIC BOTTLE Blood  Culture adequate volume  Final   Culture NO GROWTH 5 DAYS  Final   Report Status 11/16/2016 FINAL  Final     Labs: CBC:  Recent Labs Lab 11/12/16 0136 11/13/16 0827 11/14/16 0441 11/15/16 0416 11/16/16 0429  WBC 15.2* 16.5* 14.8* 14.1* 15.4*  NEUTROABS 11.8*  --   --   --   --   HGB 9.6* 9.4* 9.1* 8.9* 8.8*  HCT 28.3* 29.1* 27.7* 26.7* 26.7*  MCV 82.3 83.4 85.0 83.7 81.9  PLT 188 193 186 180 063   Basic Metabolic Panel:  Recent Labs Lab 11/11/16 1219 11/11/16 1908 11/12/16 0136 11/14/16 0441 11/15/16 0416 11/16/16 0429  NA 137  --  137 139 138 134*  K 5.2*  --  4.5 4.1 3.5 3.6  CL 101  --  108 113* 111 108  CO2 18*  --  19* 17* 19* 19*  GLUCOSE 123*  --  103* 92 98 95  BUN 56*  --  41* 18 13 11   CREATININE 1.50*  --  1.13* 0.83 0.87 0.88  CALCIUM 9.8  --  8.4* 8.0* 7.9* 8.1*  MG  --  2.3  --   --   --   --    Liver Function Tests:  Recent Labs Lab 11/11/16 1219 11/12/16 0136 11/14/16 0441  AST 26 17 16   ALT 16 12* 11*  ALKPHOS 322* 210* 162*  BILITOT 1.2 1.0 0.7  PROT 7.4 5.0* 4.5*  ALBUMIN 2.9* 2.1* 1.7*   Urinalysis    Component Value Date/Time   COLORURINE YELLOW 11/11/2016 [place] 11/11/2016 1528   LABSPEC 1.010 11/11/2016 1528   PHURINE 5.0 11/11/2016 1528   GLUCOSEU NEGATIVE 11/11/2016 1528   HGBUR TRACE (A) 11/11/2016 1528   BILIRUBINUR NEGATIVE 11/11/2016 1528   KETONESUR 15 (A) 11/11/2016 1528   PROTEINUR NEGATIVE 11/11/2016 1528   NITRITE POSITIVE (A) 11/11/2016 1528   LEUKOCYTESUR LARGE (A) 11/11/2016 1528      Time coordinating discharge: Over 30 minutes  SIGNED:  Vernell Leep, MD, FACP, FHM. [facility] Pager [phone]  If 7PM-7AM, please contact night-coverage [email or web address] Password [email or web address] 11/16/2016, 4:09 PM

## 2016-11-16 NOTE — Care Management Note (Signed)
Case Management Note  Patient Details  Name: Alexandra Mcmahon MRN: [id number] Date of Birth: 05-Sep-1933  Subjective/Objective:     CM following for progression and d/c planning.                Action/Plan: 11/16/2016 Met with pt who stated that her son's were here but meeting with the doctor re her d/c plans. This CM awaited return of pt son's who were actually meeting with the [facility] NP. This CM was informed that they plan to move this pt to a residential facility. I clarified with them that they plan on a hospice facility and not home with hospice services. I then informed CSW, Crawford Givens of family plans as she will offer family options for residential hospice facilities. Noted that PT is recommending SNF with PT and the pt is alert and talking with family and case manager , symptom management does not appear at this time to be a problem.  Await Pall NP note to confirm actual recommendation for residential hospice. Will continue to follow in case pt does not qualify for residential hospice and other options need to be considered.   Expected Discharge Date:                  Expected Discharge Plan:  [place]  In-House Referral:  Clinical Social Work  Discharge planning Services  CM Consult  Post Acute Care Choice:    Choice offered to:     DME Arranged:    DME Agency:     HH Arranged:    [place] Agency:     Status of Service:  In process, will continue to follow  If discussed at Long Length of Stay Meetings, dates discussed:    Additional Comments:  Adron Bene, RN 11/16/2016, 1:47 PM

## 2016-11-16 NOTE — Clinical Social Work Note (Signed)
CSW received referral regarding hospice placement for patient and was informed by Haynes Dage with Palliative care that [facility] can accept patient on Saturday morning. CSW talked with Cassandra at [facility] and requested clinicals faxed to facility. CSW visited patient's room and talked with family and they are requesting a 10 am ambulance pick-up. Weekend CSW will be provided with a handoff regarding discharge on Saturday.  Neilah Fulwider Givens, MSW, LCSW Licensed Clinical Social Worker [place] [phone]

## 2016-11-16 NOTE — Discharge Instructions (Signed)

## 2016-11-16 NOTE — Evaluation (Signed)
Physical Therapy Evaluation Patient Details Name: Alexandra Mcmahon MRN: [id number] DOB: 07-Jun-1933 Today's Date: 11/16/2016   History of Present Illness  Pt is a 83 yo female admitted through ED on 11/11/16 with generalized weakness and N&V. Pt was found to have lesions of pancreatic tail and liver and a L common femoral vein DVT, UTI and AKI. Pt had a guided US liver biopsy on 11/12/16. Per oncology, they are not going to treat and are commending hospice. PMH significant for HTN and HLD.    Clinical Impression  Pt presents with the above diagnosis and below deficits for therapy evaluation. Prior to admission, pt was living alone in a single level home and was completely independent. Pt currently requires Min A for bed mobs and Mod A for transfer this session due to significant weakness from being in bed for the past 5 days. Pt will benefit from SNF at discharge in order to improve her strength before returning home. Obviously due to prognosis, family may want patient to return home. Pt will need a 3N1 and WC if she returns home with HHPT.     Follow Up Recommendations SNF;Supervision/Assistance - 24 hour    Equipment Recommendations  3in1 (PT)    Recommendations for Other Services OT consult     Precautions / Restrictions Precautions Precautions: Fall Restrictions Weight Bearing Restrictions: No      Mobility  Bed Mobility Overal bed mobility: Needs Assistance Bed Mobility: Supine to Sit     Supine to sit: Min assist     General bed mobility comments: Min A and cues for hand placemen on rail and to scoot hips EOB  Transfers Overall transfer level: Needs assistance Equipment used: Rolling walker (2 wheeled) Transfers: Sit to/from [facility] Sit to Stand: Mod assist Stand pivot transfers: Mod assist       General transfer comment: Mod A to stand from EOB and to pivot transfer with increased diffiuclty picking up LE's to turn toward recliner.   Ambulation/Gait              General Gait Details: Unable this session  Stairs            Wheelchair Mobility    Modified Rankin (Stroke Patients Only)       Balance Overall balance assessment: Needs assistance Sitting-balance support: Feet supported;Single extremity supported Sitting balance-Leahy Scale: Fair     Standing balance support: Bilateral upper extremity supported Standing balance-Leahy Scale: Poor Standing balance comment: reliant on Rw and external support to maintain balance in standing                             Pertinent Vitals/Pain Pain Assessment: No/denies pain    Home Living Family/patient expects to be discharged to:: Private residence Living Arrangements: Alone Available Help at Discharge: Family;Available PRN/intermittently;Other (Comment) (unsure) Type of Home: Apartment Home Access: Level entry     Home Layout: One level Home Equipment: Walker - 2 wheels;Shower seat      Prior Function Level of Independence: Independent with assistive device(s)         Comments: was completely independent prior to admission. family assiststed with IADls     Hand Dominance   Dominant Hand: Right    Extremity/Trunk Assessment   Upper Extremity Assessment Upper Extremity Assessment: Defer to OT evaluation    Lower Extremity Assessment Lower Extremity Assessment: Generalized weakness    Cervical / Trunk Assessment Cervical / Trunk Assessment: Kyphotic  Communication   Communication: No difficulties  Cognition Arousal/Alertness: Awake/alert Behavior During Therapy: WFL for tasks assessed/performed Overall Cognitive Status: Within Functional Limits for tasks assessed                                        General Comments      Exercises     Assessment/Plan    PT Assessment Patient needs continued PT services  PT Problem List Decreased strength;Decreased activity tolerance;Decreased mobility;Decreased  balance;Decreased knowledge of use of DME       PT Treatment Interventions DME instruction;Gait training;Functional mobility training;Therapeutic activities;Therapeutic exercise;Balance training;Patient/family education    PT Goals (Current goals can be found in the Care Plan section)  Acute Rehab PT Goals Patient Stated Goal: to feel better PT Goal Formulation: Patient unable to participate in goal setting Time For Goal Achievement: 11/30/16 Potential to Achieve Goals: Fair    Frequency Min 2X/week   Barriers to discharge        Co-evaluation               AM-PAC PT "6 Clicks" Daily Activity  Outcome Measure Difficulty turning over in bed (including adjusting bedclothes, sheets and blankets)?: Total Difficulty moving from lying on back to sitting on the side of the bed? : Total Difficulty sitting down on and standing up from a chair with arms (e.g., wheelchair, bedside commode, etc,.)?: Total Help needed moving to and from a bed to chair (including a wheelchair)?: Total Help needed walking in hospital room?: Total Help needed climbing 3-5 steps with a railing? : Total 6 Click Score: 6    End of Session Equipment Utilized During Treatment: Gait belt Activity Tolerance: Patient limited by fatigue Patient left: in chair;with call bell/phone within reach;with chair alarm set;with nursing/sitter in room;with family/visitor present Nurse Communication: Mobility status PT Visit Diagnosis: Unsteadiness on feet (R26.81);Muscle weakness (generalized) (M62.81);Difficulty in walking, not elsewhere classified (R26.2)    Time: 4975-3005 PT Time Calculation (min) (ACUTE ONLY): 21 min   Charges:   PT Evaluation $PT Eval Moderate Complexity: 1 Mod     PT G Codes:        Scheryl Marten PT, DPT  [phone]   Shanon Rosser 11/16/2016, 1:15 PM

## 2016-11-16 NOTE — Progress Notes (Signed)
ANTICOAGULATION CONSULT NOTE   Pharmacy Consult for Heparin Indication: DVT  No Known Allergies  Patient Measurements: Height: 5\' 2"  (157.5 cm) Weight: 106 lb 4.2 oz (48.2 kg) IBW/kg (Calculated) : 50.1  Heparin Dosing Weight: 50 kg  Vital Signs: Temp: 98.2 F (36.8 C) (08/10 0505) Temp Source: Oral (08/10 0505) BP: 127/82 (08/10 0505) Pulse Rate: 72 (08/10 0505)  Labs:  Recent Labs  11/14/16 0441 11/15/16 0416 11/16/16 0429  HGB 9.1* 8.9* 8.8*  HCT 27.7* 26.7* 26.7*  PLT 186 180 168  HEPARINUNFRC 0.48 0.52 0.68  CREATININE 0.83 0.87 0.88    Estimated Creatinine Clearance: 38.8 mL/min (by C-G formula based on SCr of 0.88 mg/dL).   Medical History: Past Medical History:  Diagnosis Date  . High cholesterol   . Hypertension     Assessment: 39 YOF on IV heparin for new DVT in L common femoral vein. Heparin level is therapeutic on heparin at 800 units/hr CBC stable, no overt bleeding or complications noted.   Goal of Therapy:  Heparin level 0.3-0.7 units/ml Monitor platelets by anticoagulation protocol: Yes   Plan:  -Continue heparin at 800 units/hr -Daily heparin level, CBC -F/u plan for oral anticoagulation  Thank you Anette Guarneri, PharmD [phone]  11/16/2016 8:58 AM

## 2016-11-16 NOTE — Consult Note (Signed)
Consultation Note Date: 11/16/2016   Patient Name: Alexandra Mcmahon  DOB: 1933/05/24  MRN: [id number]  Age / Sex: 83 y.o., female  PCP: Vincente Liberty, MD Referring Physician: Modena Jansky, MD  Reason for Consultation: Establishing goals of care and Psychosocial/spiritual support  HPI/Patient Profile: 83 y.o. female  with past medical history of HTN and hyperlipidemia who was admitted on 11/11/2016 with weakness and abdominal pain. In the emergency department a CT was ordered that showed " 3.5 cm pancreatic lesion with innumerable liver lesions. Findings are most compatible with metastatic pancreatic cancer."  It also showed possible DVT in left femoral vein as well as indetermininate nodule and densities at the left lung base. The patient's CA 19-9 is 75K. The liver biopsy revealed carcinoma-suspected adenocarcinoma of possible pancreatic origin.   Clinical Assessment and Goals of Care:  I have reviewed medical records including EPIC notes, labs and imaging, received report from her nurse, assessed the patient and then met at the bedside along with two of her sons, Ilona Sorrel and Park Liter as well as their wives  to discuss diagnosis prognosis, GOC, EOL wishes, disposition and options. Her two out of state sons were also present by speaker phone during the conversation.  They desire that she be placed in hospice house. They acknowledge that she is unable to care for herself and needs 24 hour care.  I introduced Palliative Medicine as specialized medical care for people living with serious illness. It focuses on providing relief from the symptoms and stress of a serious illness. The goal is to improve quality of life for both the patient and the family.  We discussed a brief life review of the patient. The patient has four living sons whom she raised as a single parent. They describe her as someone who is always active,  especially with her church. Her church is very important to her.   As far as functional and nutritional status. About three weeks prior to admission the family states that she was getting around her house well with help from her walker. They report that she has lived a lone for at least 41 years and has done well. They reports that she has not been eating as much and has had decreased appetite.    We discussed their current illness and what it means in the larger context of their on-going co-morbidities.  Natural disease trajectory and expectations at EOL were discussed. They were informed that oncology would not recommend chemotherapy but does recommend comfort care. They know that her cancer is a terminal diagnosis and that she may have weeks to a couple of months left. We explained that outside of the hospital she will decline very quickly.  Without artificial hydration (IVF) she will return the condition she was in at the time of admission within a few days.  Her pancreatic and liver lesions as well as her left femoral DVT will likely become very painful.  I attempted to elicit values and goals of care important to the patient. The patient values  her independence and is a very strong woman. Her son's describe her as someone who would never sit on the couch and is always up and doing things, especially with church.   The difference between aggressive medical intervention and comfort care was considered in light of the patient's goals of care. The family agreed that comfort care would be most beneficial for their mother. They are aware that chemotherapy would not be an option for their mother and agree that it would do more harm than good. They goal for her is that she be comfortable and that we do no harm.   Advanced directives, concepts specific to code status, artifical feeding and hydration, and rehospitalization were considered and discussed. The patient's four sons agreed that they do not want her  to be a Full Code. They prefer that she become a DNR.   Hospice and Palliative Care services outpatient were explained and offered.  Questions and concerns were addressed. The family was encouraged to call with questions or concerns.    Primary Decision Maker:  PATIENT. She also values input from her children.     SUMMARY OF RECOMMENDATIONS     D/C to [facility] if patient is agreeable.  Allow patient to use up a 1 month supply of Xeralto for comfort (preventing the current clot from getting bigger)  Will add PRN comfort medications (morphine for pain and dyspnea, ativan for anxiety and sleep)  Code Status/Advance Care Planning:  Change code status to DNR   Additional Recommendations (Limitations, Scope, Preferences):  Full Comfort Care  Psycho-social/Spiritual:  Desire for further Chaplaincy support: Yes. She derives a great deal of support from her religion  Prognosis: weeks to 2 months in the setting of widely metastatic pancreatic cancer to the lungs and liver.  Left femoral DVT.  Very rapid decline (3 weeks). Too weak to walk or stand on admission.  Malnourished with an albumin of 1.9.  Not seeking aggressive treatment (no chemo or radiation)  Discharge Planning: [facility] in [place] is her first choice as she grew up in [place] and has family there.      Primary Diagnoses: Present on Admission: . Hypertension . High cholesterol . Acute kidney injury ([place]) . Dehydration . Nausea & vomiting . Pancreatic mass . Liver lesions . ? DVT (deep venous thrombosis) Common Femoral vein . UTI (urinary tract infection)   I have reviewed the medical record, interviewed the patient and family, and examined the patient. The following aspects are pertinent.  Past Medical History:  Diagnosis Date  . High cholesterol   . Hypertension    Social History   Social History  . Marital status: Single    Spouse name: N/A  .  Number of children: N/A  . Years of education: N/A   Social History Main Topics  . Smoking status: Never Smoker  . Smokeless tobacco: Never Used  . Alcohol use No  . Drug use: No  . Sexual activity: Not Asked   Other Topics Concern  . None   Social History Narrative  . None   Family History  Problem Relation Age of Onset  . Breast cancer Neg Hx    Scheduled Meds: . latanoprost  1 drop Both Eyes QHS  . pantoprazole  40 mg Oral Daily  . rosuvastatin  20 mg Oral QHS  . sodium chloride flush  3 mL Intravenous Q12H  . timolol  1 drop Both Eyes Daily  . Vitamin D (Ergocalciferol)  50,000 Units Oral Q7 days   Continuous Infusions: . heparin 800 Units/hr (11/16/16 0645)   PRN Meds:.acetaminophen **OR** acetaminophen, ondansetron (ZOFRAN) IV **OR** promethazine No Known Allergies Review of Systems 10 sys  Physical Exam Patient appears to be comfortable and in high spirits. She appear to be thin.  CV: regular rate and rhythm. No murmur appreciated  Pulm: clear breath sounds bilaterally Abd: soft, non tender  Neuro: able to follow directions, awake and alert   Vital Signs: BP 135/78 (BP Location: Right Arm)   Pulse 80   Temp 98 F (36.7 C) (Oral)   Resp 18   Ht '5\' 2"'$  (1.575 m)   Wt 48.2 kg (106 lb 4.2 oz)   SpO2 100%   BMI 19.44 kg/m  Pain Assessment: No/denies pain       SpO2: SpO2: 100 % O2 Device:SpO2: 100 % O2 Flow Rate: .   IO: Intake/output summary:  Intake/Output Summary (Last 24 hours) at 11/16/16 1044 Last data filed at 11/16/16 0900  Gross per 24 hour  Intake            626.4 ml  Output                0 ml  Net            626.4 ml    LBM: Last BM Date: 11/09/16 Baseline Weight: Weight: 49.9 kg (110 lb) Most recent weight: Weight: 48.2 kg (106 lb 4.2 oz)     Palliative Assessment/Data: 30- 40%     Time In: 11:00 Time Out: 12:10 Time Total: 70 min  Greater than 50%  of this time was spent counseling and coordinating care related to the  above assessment and plan.  Signed by: Zonia Kief PA-S  Florentina Jenny, PA-C Palliative Medicine Pager: [phone]  Please contact Palliative Medicine Team phone at [phone] for questions and concerns.  For individual provider: See Shea Evans

## 2016-11-17 MED ORDER — RIVAROXABAN (XARELTO) VTE STARTER PACK (15 & 20 MG): ORAL_TABLET | ORAL | 0 refills | Status: AC

## 2016-11-17 NOTE — Progress Notes (Signed)
Clinical Social Worker facilitated patient discharge including contacting patient family and facility to confirm patient discharge plans.  Clinical information faxed to facility and family agreeable with plan.  CSW arranged ambulance transport via [facility] to [facility].  RN to call [phone] for report prior to discharge.  Clinical Social Worker will sign off for now as social work intervention is no longer needed. Please consult [place] again if new need arises.  Rhea Pink, MSW, [place]

## 2016-11-17 NOTE — Progress Notes (Signed)
Alexandra Mcmahon to be D/C'd to [facility]  per MD order.  Discussed prescriptions and follow up appointments with the patient. Prescriptions given to patient, medication list explained in detail. Pt verbalized understanding.  Allergies as of 11/17/2016   No Known Allergies     Medication List    STOP taking these medications   ibuprofen 400 MG tablet Commonly known as:  ADVIL,MOTRIN   olmesartan 20 MG tablet Commonly known as:  BENICAR   rosuvastatin 20 MG tablet Commonly known as:  CRESTOR   Vitamin D (Ergocalciferol) 50000 units Caps capsule Commonly known as:  DRISDOL     TAKE these medications   morphine CONCENTRATE 10 MG/0.5ML Soln concentrated solution Take 0.25 mLs (5 mg total) by mouth every 4 (four) hours as needed for moderate pain, severe pain, anxiety or shortness of breath.   Rivaroxaban 15 & 20 MG Tbpk Commonly known as:  XARELTO STARTER PACK Take as directed on package: Start with one 15mg  tablet by mouth twice a day with food. On Day 22, switch to one 20mg  tablet once a day with food.  Pharmacy: Please discard the first 2 tablets from the pack which were given in the hospital.   timolol 0.5 % ophthalmic solution Commonly known as:  TIMOPTIC Place 1 drop into both eyes daily.   Travoprost (BAK Free) 0.004 % Soln ophthalmic solution Commonly known as:  TRAVATAN Place 1 drop into both eyes at bedtime.       Vitals:   11/17/16 0818 11/17/16 1100  BP: 138/65   Pulse: 84   Resp: 16   Temp: (!) 97.2 F (36.2 C) 97.7 F (36.5 C)  SpO2: 100%     Skin clean, dry and intact without evidence of skin break down, no evidence of skin tears noted. IV catheter discontinued intact. Site without signs and symptoms of complications. Dressing and pressure applied. Pt denies pain at this time. No complaints noted.  An After Visit Summary was printed and given to the patient. Patient escorted via stretcher  and D/C to hospice via [facility].  Emilio Math, RN [facility]  [facility] Phone [phone]

## 2016-11-17 NOTE — Progress Notes (Signed)
Jennye Moccasin Rx cards addressed by Suzzette Righter 8/10. Unable to reach family or patient to verify.

## 2016-11-17 NOTE — Progress Notes (Signed)
Progress Notes.  Patient was supposed to discharge to hospice home on 8/10 but there was no bed available until today and hence did not leave yesterday. Patient interviewed and examined this morning. Ate all of her breakfast. Denies complaints. Denies pain. Patient appears comfortable and sitting up in bed. Vital signs stable. Physical exam stable and unchanged compared to yesterday. As per RN, no acute issues overnight.  Patient is supposed to discharge to hospice home today.  Vernell Leep, MD, FACP, FHM. [facility] Pager [phone]  If 7PM-7AM, please contact night-coverage [email or web address] Password [facility] 11/17/2016, 11:36 AM

## 2017-01-07 DEATH — deceased

## 2018-11-09 IMAGING — CT CT ABD-PELV W/ CM
2 of 5 series · 14 of 46 positions shown, 16 images · IV contrast (APPLIED)
Comparison: None.

CLINICAL DATA: 82-year-old with nausea and vomiting. Symptoms
started 2 weeks ago. Abdominal pain.

EXAM:
CT ABDOMEN AND PELVIS WITH CONTRAST
TECHNIQUE: Multidetector CT imaging of the abdomen and pelvis was performed
using the standard protocol following bolus administration of
intravenous contrast.
CONTRAST:  75mL 8AOWOB-MHH IOPAMIDOL (8AOWOB-MHH) INJECTION 61%

[Series 3: abdomen 5.0 · axial · 0.76mm/px · z∈[+716,+1080]mm · 11 of 85 slices shown, 13 images]
[im 6/85  soft-tissue]
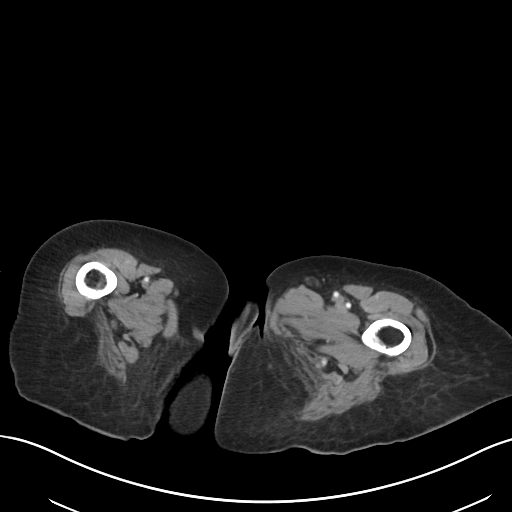
[im 6/85  bone]
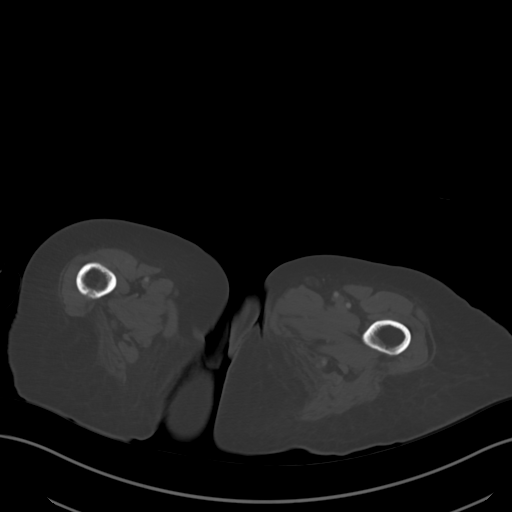
[im 12/85  soft-tissue]
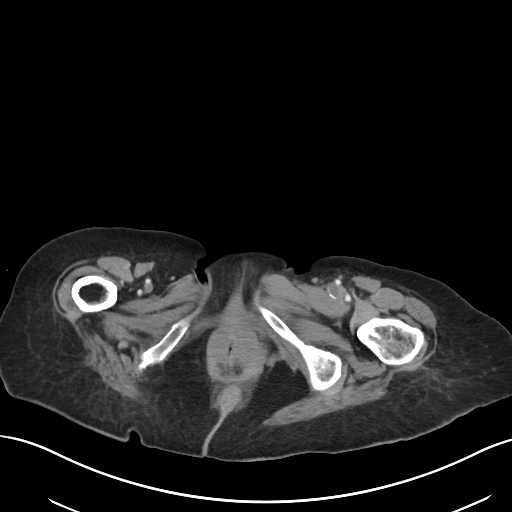
[im 23/85  soft-tissue]
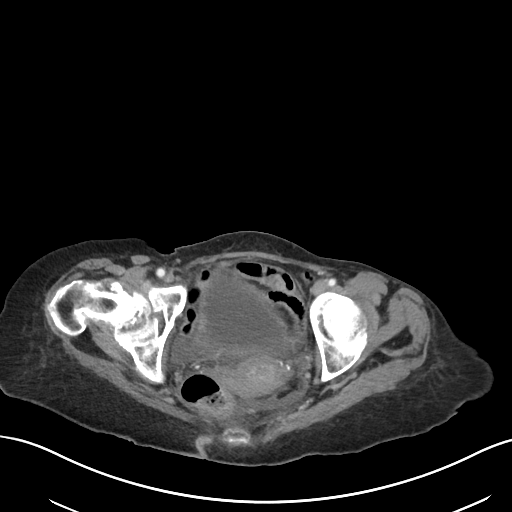
[im 29/85  soft-tissue]
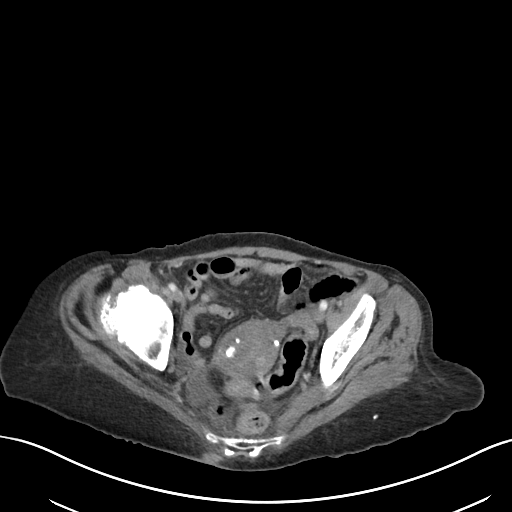
[im 34/85  soft-tissue]
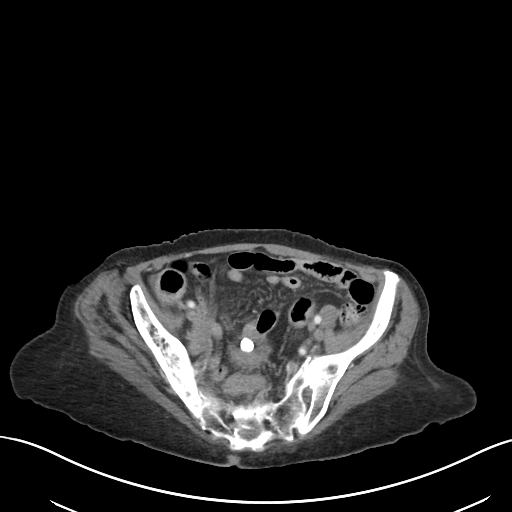
[im 45/85  soft-tissue]
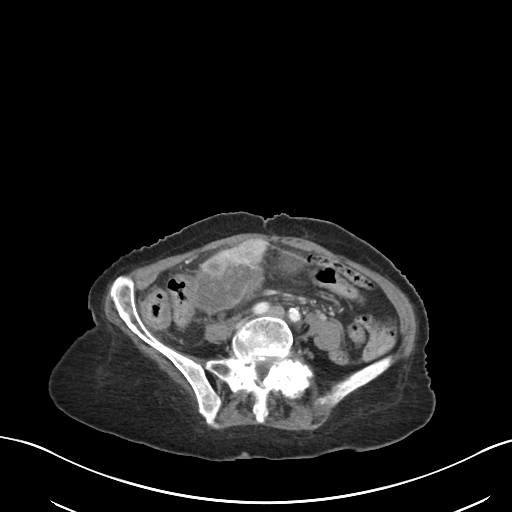
[im 51/85  soft-tissue]
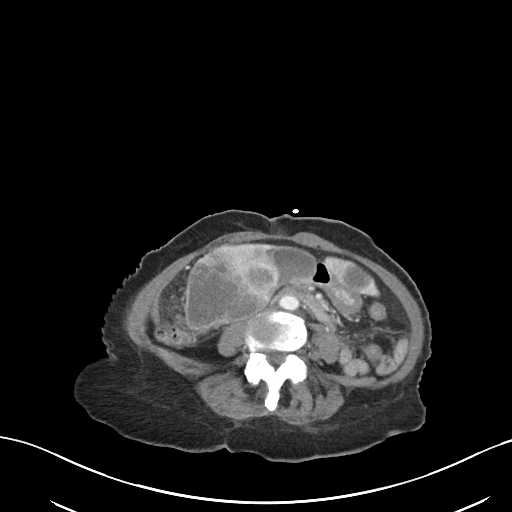
[im 57/85  soft-tissue]
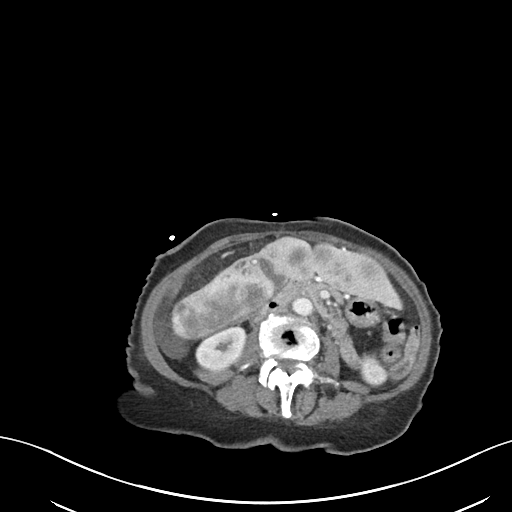
[im 62/85  soft-tissue]
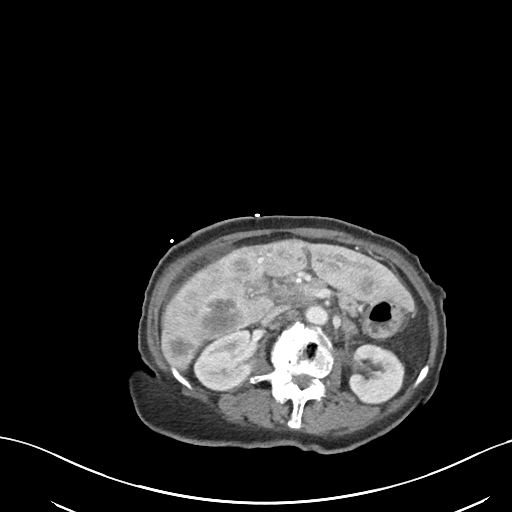
[im 62/85  bone]
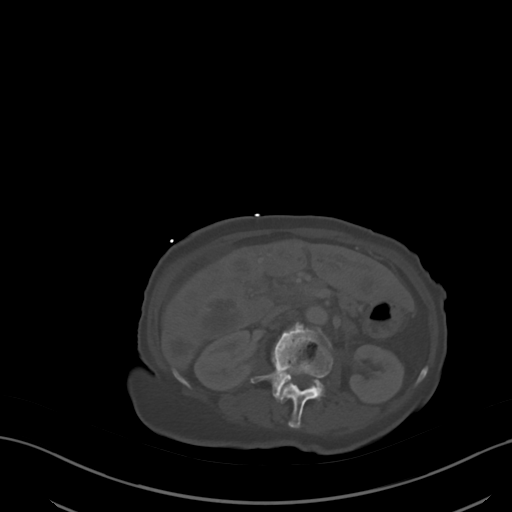
[im 73/85  soft-tissue]
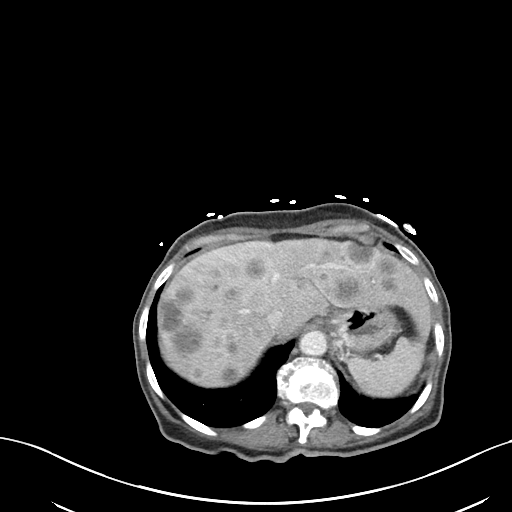
[im 79/85  soft-tissue]
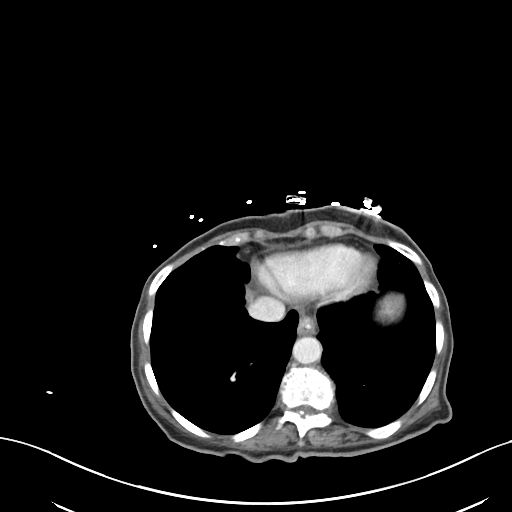

[Series 6: abdomen 3.0 mpr cor · coronal · 0.66mm/px · 3 of 101 slices shown]
[im 34/101  soft-tissue]
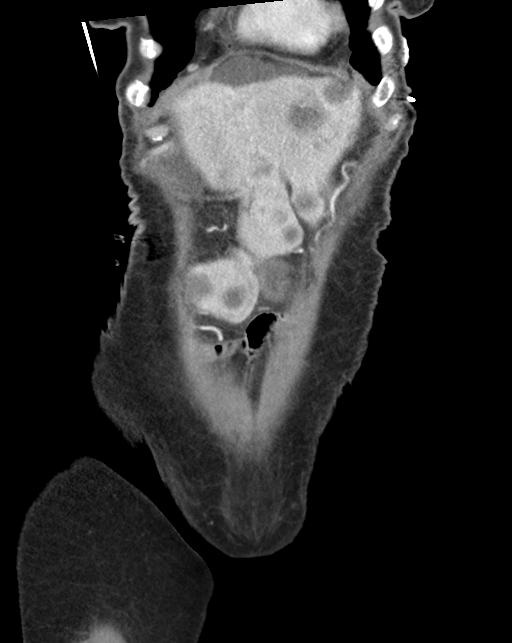
[im 45/101  soft-tissue]
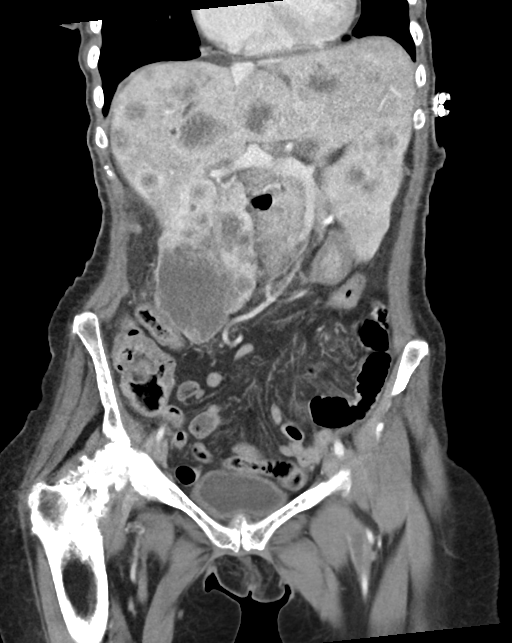
[im 56/101  soft-tissue]
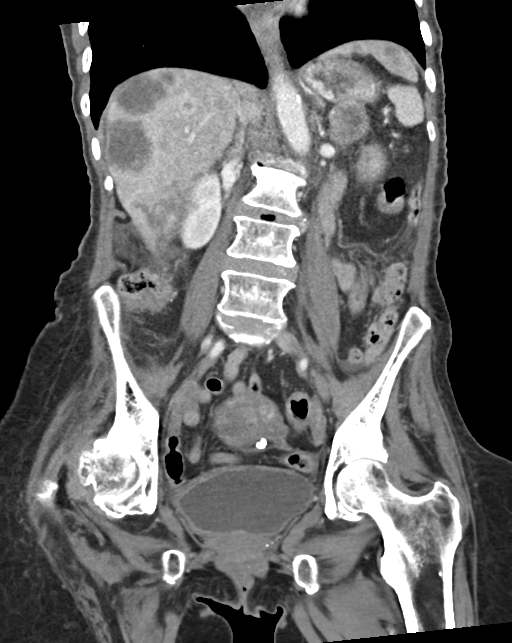

[14 of 46 positions shown; findings below may reference images not displayed]

FINDINGS: Lower chest: 4 mm nodule in the lingula on sequence 5, image 16.
Bandlike density in the left lower lobe on sequence 5, image 15
measures 5 mm in width but the length is roughly 21 mm. No large
pleural effusions.

Hepatobiliary: Innumerable low-density hepatic lesions throughout
both lobes of the liver. Index lesion in the right hepatic lobe
measuring 5.5 x 3.4 cm on sequence 3, image 18. Second index lesion
is in the inferior right hepatic lobe measuring 6.1 x 5.1 cm on
image 38. Small amount of perihepatic edema and ascites. Main portal
venous system appears to be patent. Liver has a nodular contour.
There is mild intrahepatic biliary dilatation in the anterior right
hepatic lobe on sequence 3, image 15. Small amount of fluid around
the gallbladder. No gross abnormality to the gallbladder. No
significant extrahepatic biliary dilatation.

Pancreas: Lobulated low-density lesion at the pancreatic tail
measuring 3.5 x 2.5 cm on sequence 3, image 18. Findings are
suggestive for a primary pancreatic neoplasm. No significant
pancreatic duct dilatation.

Spleen: Normal appearance of the spleen was minimal perisplenic
ascites.

Adrenals/Urinary Tract: Normal adrenal glands. Small cyst in the
right kidney upper pole. No suspicious renal lesions. Negative for
hydronephrosis. Wall thickening along the right side of the urinary
bladder. No definite renal or urinary stones.

Stomach/Bowel: No significant bowel dilatation. No gross abnormality
to the stomach or duodenum. The appendix appears to be normal. Mild
asymmetric wall thickening in the ascending colon on sequence 3,
image 41 is nonspecific and could be related to incomplete
distension. Normal appearance of the terminal ileum.

Vascular/Lymphatic: Atherosclerotic calcifications in the aorta and
iliac arteries without aortic aneurysm. SMA is small but appears to
be patent. Celiac trunk is patent. IMA appears to be patent. The
infrarenal IVC is compressed to due to the enlarged right hepatic
lobe. Enlarged lymph node at porta hepatis measuring 1.4 cm in the
short axis on sequence 6 image 21. Mild expansion of the left common
femoral vein and concern for a filling defect in left common femoral
vein.

Reproductive: Multiple calcifications in the uterus. No suspicious
adnexal lesions.

Other: Small amount of free fluid in the pelvis. Negative for free
air.

Musculoskeletal: Levoscoliosis of the thoracolumbar spine. Severe
degenerative disc disease at T12-L1. Severe joint space loss and
degenerative changes in the right hip joint. Subtle areas of lucency
in the pelvic bones probably related to osteoporosis.
IMPRESSION: 3.5 cm pancreatic lesion with innumerable liver lesions. Findings
are most compatible with metastatic pancreatic cancer. Liver lesions
would be amenable to percutaneous biopsy.

Concern for a DVT in the left common femoral vein. Recommend a lower
extremity venous duplex examination.

Small amount of abdominal ascites. Mild lymphadenopathy in the upper
abdomen.

Mild wall thickening in the ascending colon is nonspecific.
Neoplastic process in the right colon is thought to be less likely
but cannot be completely excluded.

Indeterminate nodules and densities at the left lung base. Recommend
follow-up chest CT to evaluate for metastatic chest disease.

Wall thickening along the right side of the urinary bladder.
Findings could be related to cystitis but indeterminate. Consider
non emergent Urology consultation.

Severe degenerative disease in the right hip. Scoliosis with
multilevel degenerative spine disease.

These results were called by telephone at the time of interpretation
on 11/11/2016 at [DATE] to Dr. SIGURIA AUTOBUSI , who verbally
acknowledged these results.

## 2018-11-11 IMAGING — CT CT CHEST W/O CM
2 of 3 series · 15 of 36 positions shown, 18 images · non-contrast
Comparison: CT of the abdomen on 11/11/2016

CLINICAL DATA: CT of the abdomen and pelvis demonstrating mass of
the tail of the pancreas and widespread hepatic metastatic disease.
Imaged lung bases showed a 4 mm inferior lingular nodule and
bandlike area of opacity at the base of the left lower lobe. Status
post liver lesion biopsy.

EXAM:
CT CHEST WITHOUT CONTRAST
TECHNIQUE: Multidetector CT imaging of the chest was performed following the
standard protocol without IV contrast.

[Series 3: chest w/o 2mm st · axial · non-contrast · 0.59mm/px · z∈[-293,-27]mm · 12 of 157 slices shown, 15 images]
[im 12/157  mediastinal]
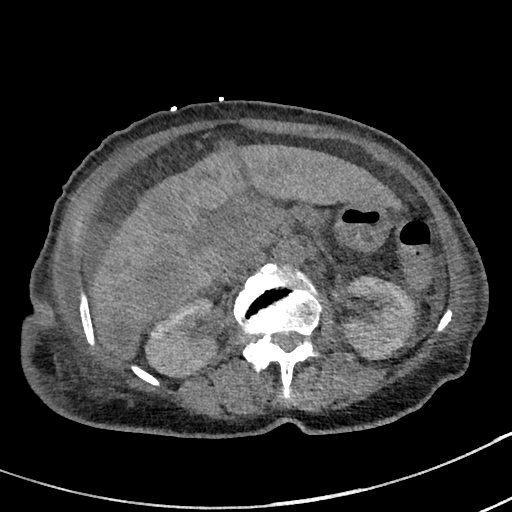
[im 12/157  lung]
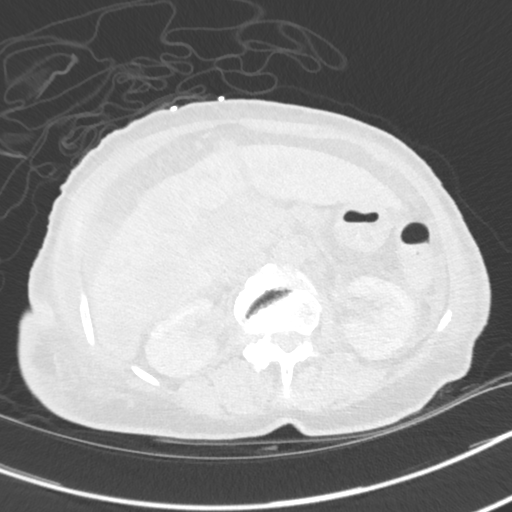
[im 24/157  lung]
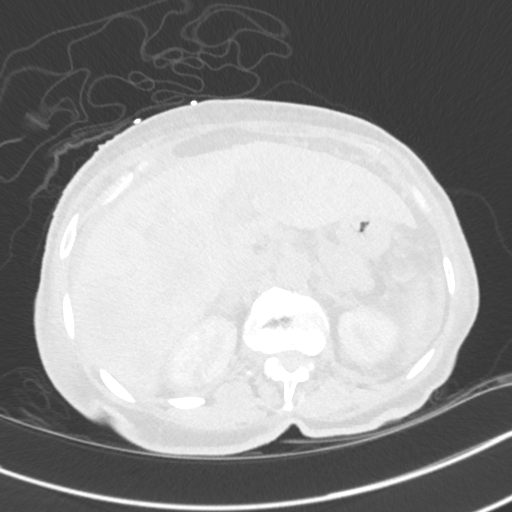
[im 35/157  lung]
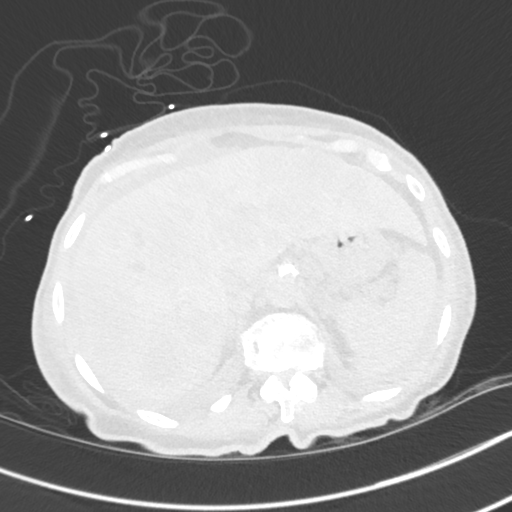
[im 47/157  lung]
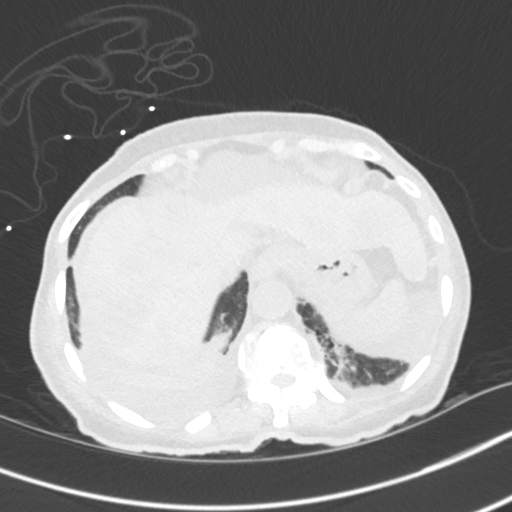
[im 58/157  mediastinal]
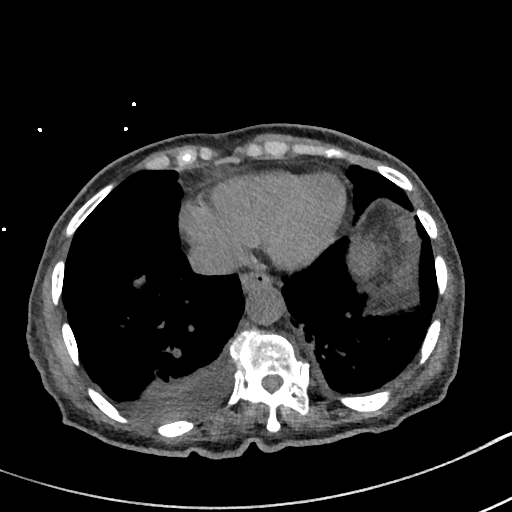
[im 58/157  lung]
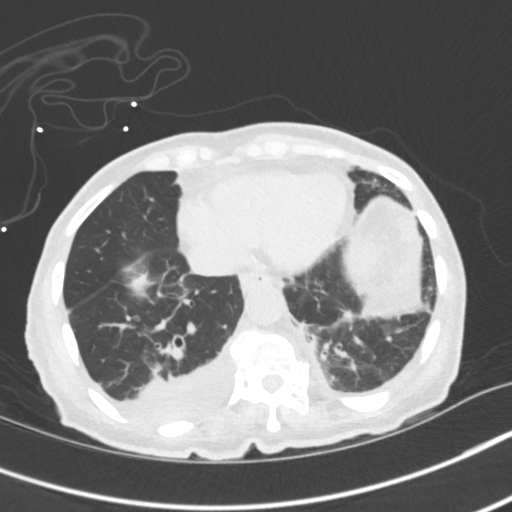
[im 70/157  lung]
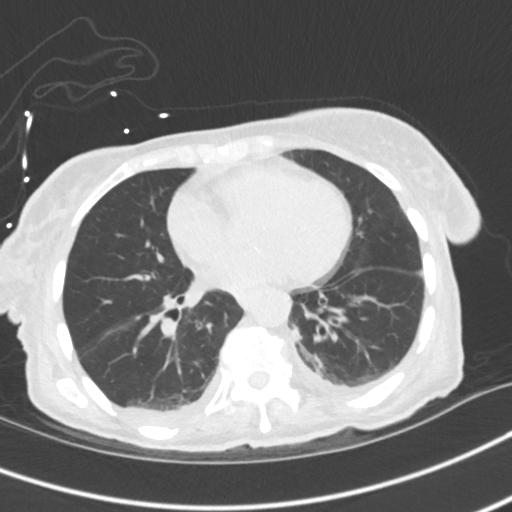
[im 87/157  lung]
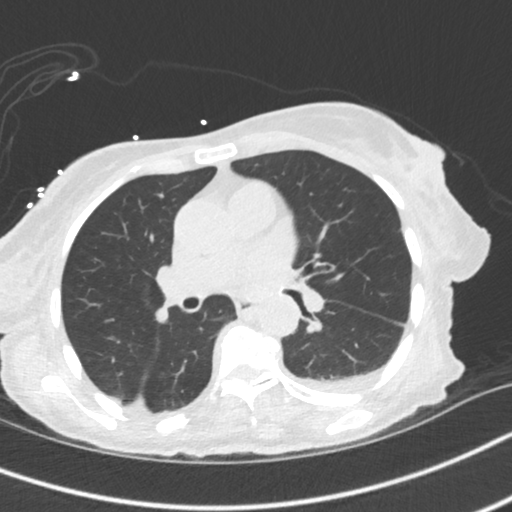
[im 99/157  lung]
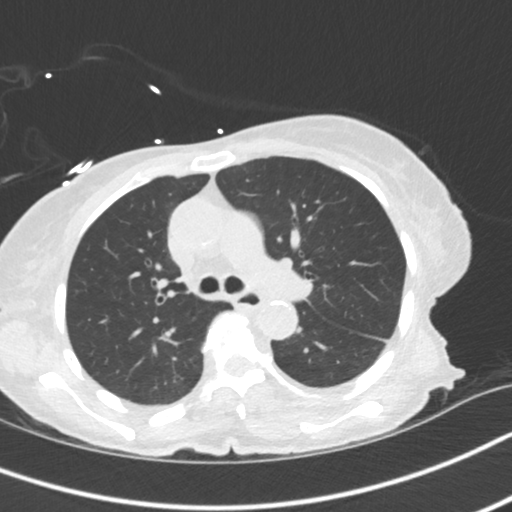
[im 110/157  mediastinal]
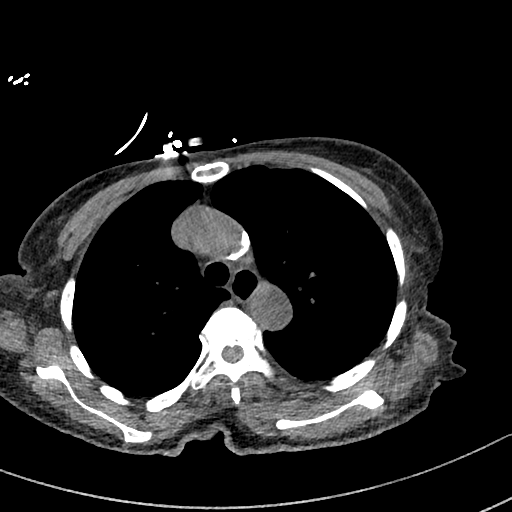
[im 110/157  lung]
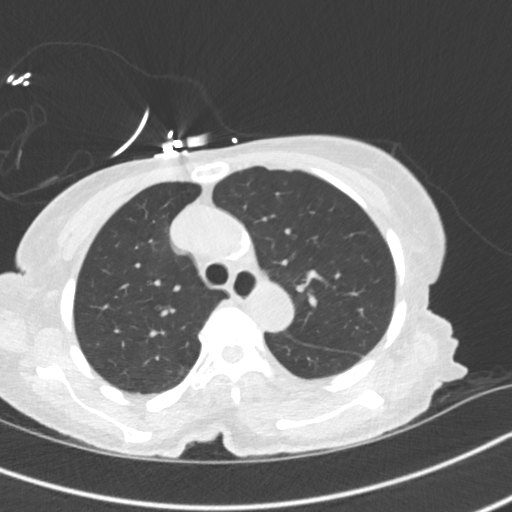
[im 122/157  lung]
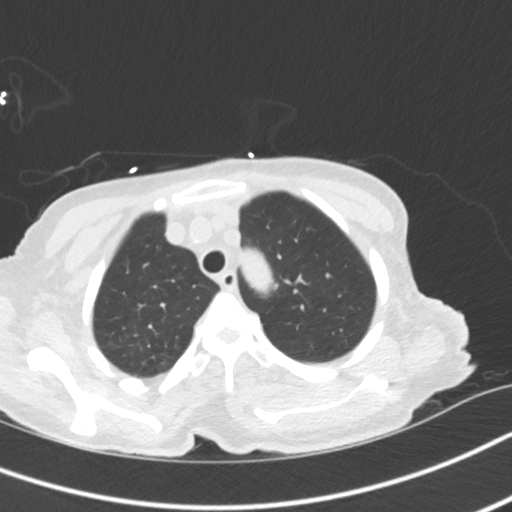
[im 133/157  lung]
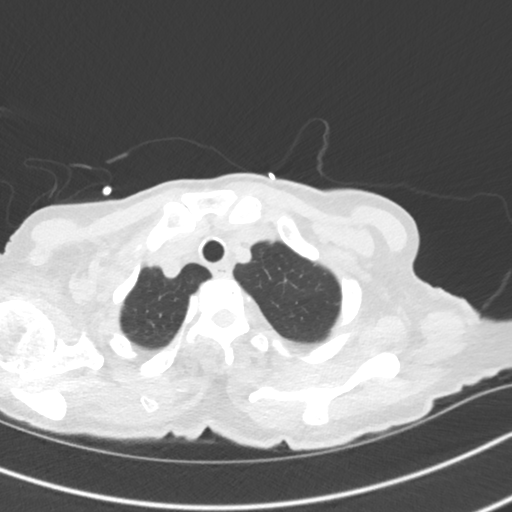
[im 145/157  lung]
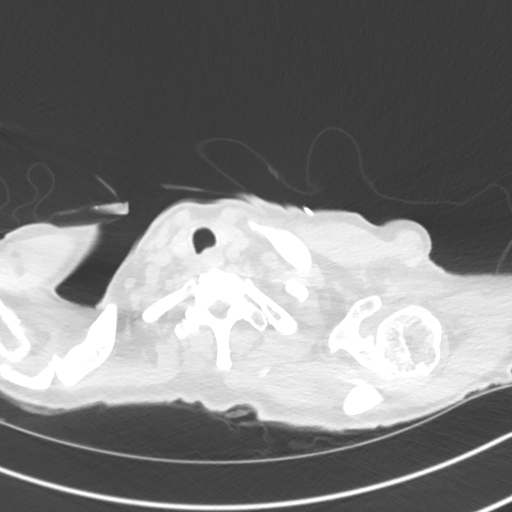

[Series 5: chest w/o 3mm st cor · coronal · non-contrast · 0.54mm/px · 3 of 65 slices shown]
[im 13/65  lung]
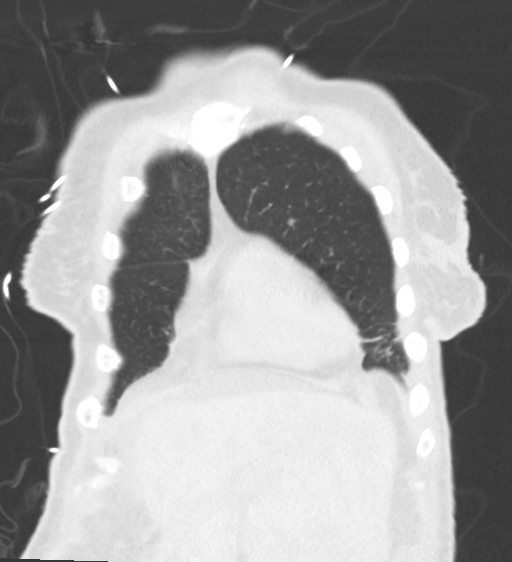
[im 26/65  lung]
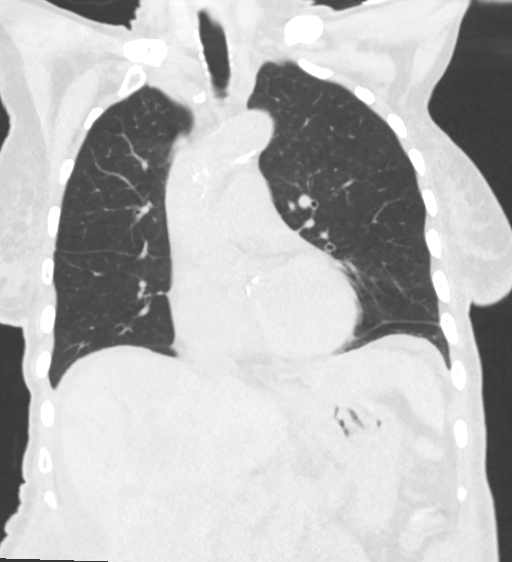
[im 39/65  lung]
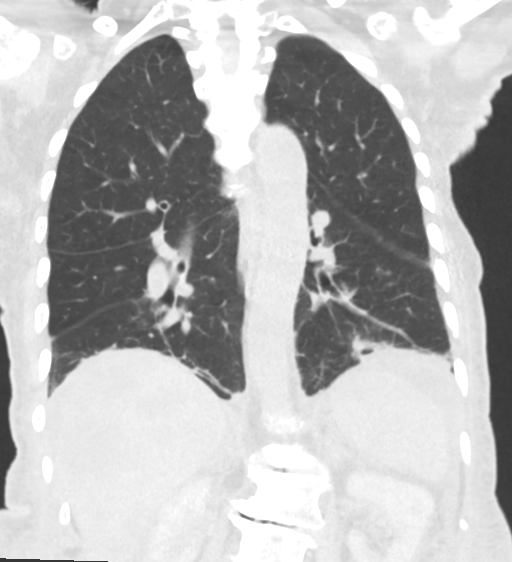

[15 of 36 positions shown; findings below may reference images not displayed]

FINDINGS: Cardiovascular: Normal heart size. No pericardial effusion.
Scattered calcified plaque is noted in the distribution of the LAD
and left circumflex coronary arteries. Scattered atherosclerotic
plaque in the thoracic aorta without evidence of aneurysmal disease.

Mediastinum/Nodes: No obvious mediastinal masses. No overt
lymphadenopathy identified.

Lungs/Pleura: Since the prior abdominal CT, there is a new small
right pleural effusion and dense atelectasis/ consolidation
involving the posterior aspect of the right lower lobe. Although
this may be on the basis of atelectasis, correlation suggested with
any clinical evidence to suggest pneumonia or aspiration. There is a
very small left pleural effusion.

No evidence to suggest obvious pulmonary metastatic disease.
Scarring is present at the left lung base and there is a stable
small inferior lingular nodule measuring approximately 4 mm.

Upper Abdomen: Visible metastatic disease of the liver and free
fluid adjacent to the liver.

Musculoskeletal: No evidence of bone metastases or fractures.
Diffuse degenerative disc disease is present throughout the thoracic
spine.
IMPRESSION: 1. Coronary atherosclerosis with scattered calcified plaque in the
distribution of the LAD and left circumflex coronary arteries. The
thoracic aorta also demonstrates atherosclerosis without aneurysmal
disease.
2. New small right pleural effusion and dense consolidation of the
posterior right lower lobe since the prior abdominal CT 2 days ago.
This raises the possibility of pneumonia or aspiration. There is a
small left pleural effusion.
3. Stable isolated 4 mm nodule in the inferior lingula. No other
discrete pulmonary nodules are identified to suggest obvious
pulmonary metastatic disease.

Aortic Atherosclerosis ([id number]).

## 2019-03-21 IMAGING — US US BIOPSY
1 series · 13 of 25 positions shown · non-contrast
Comparison: none

CLINICAL DATA: pancreatic mass. Multiple liver lesions suggesting
metastatic disease. Biopsy requested

[Series 1: us biopsy · 0.22mm/px · 13 of 27 slices shown]
[im 1/27]
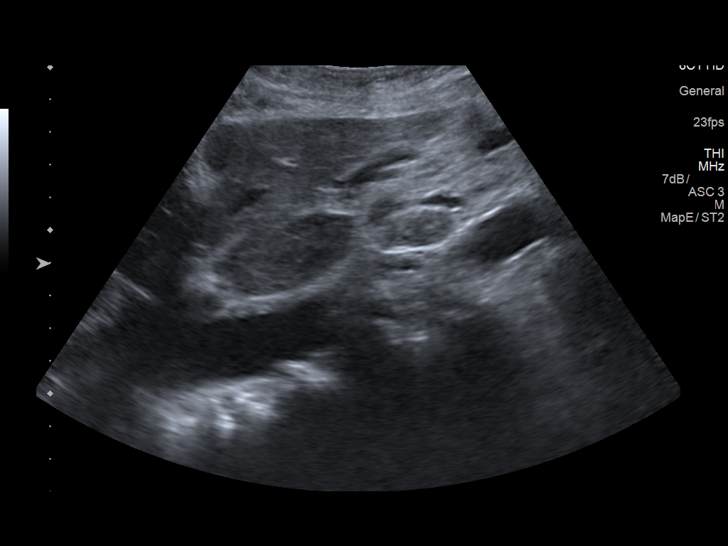
[im 3/27]
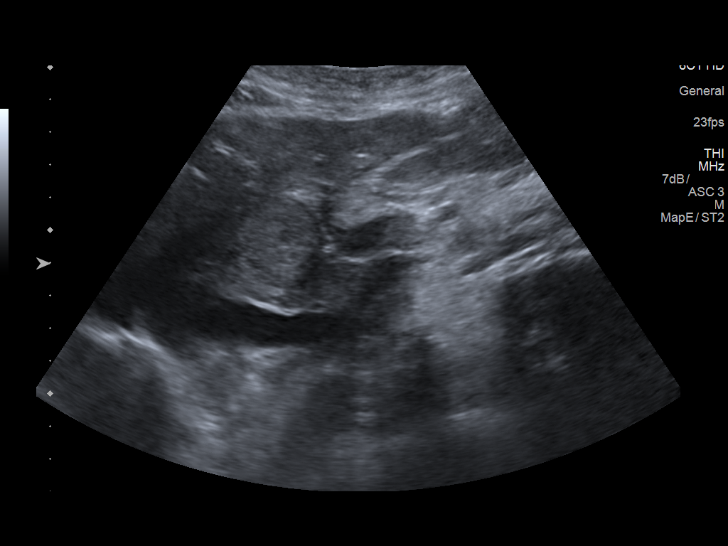
[im 5/27]
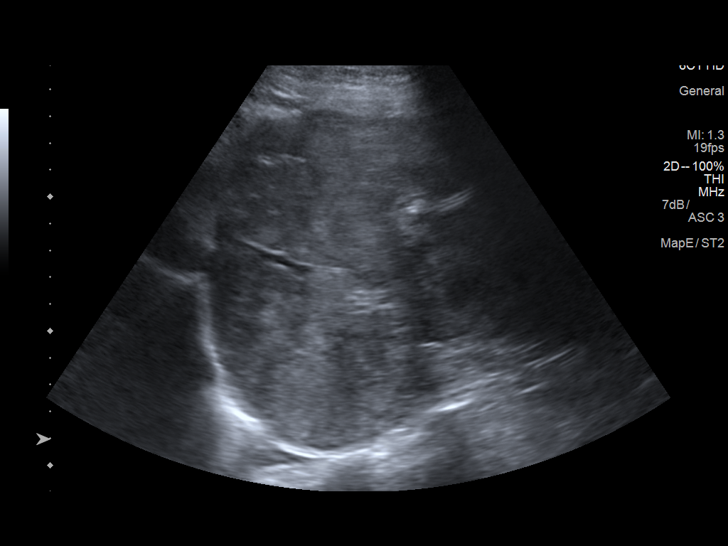
[im 7/27]
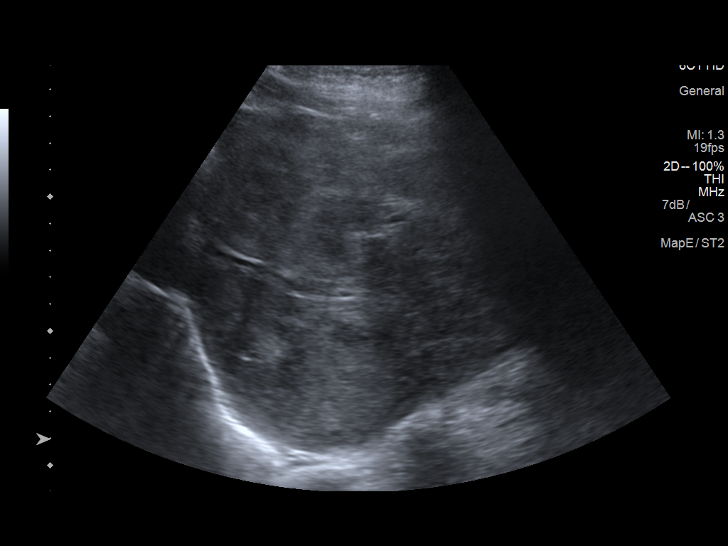
[im 9/27]
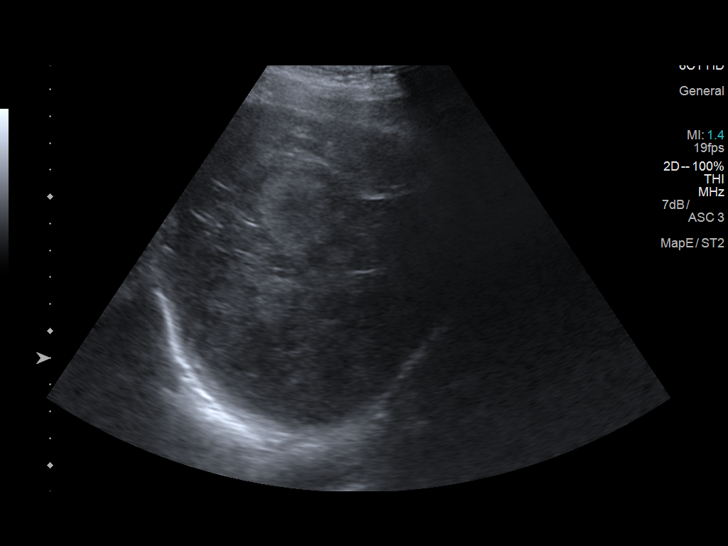
[im 11/27]
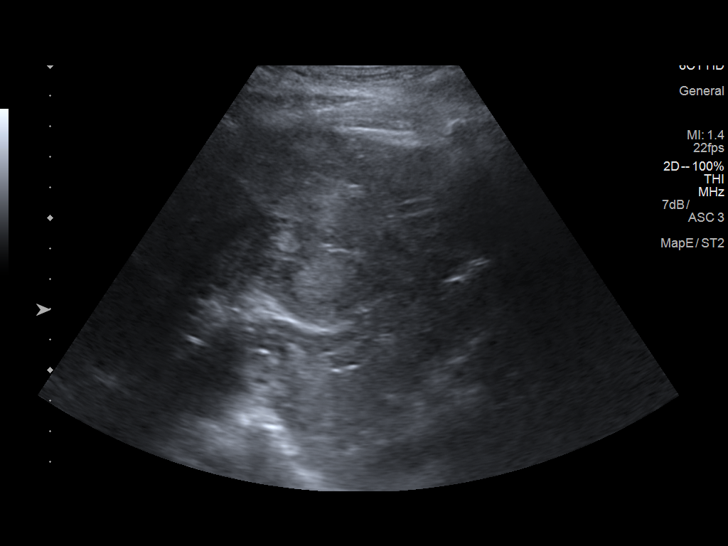
[im 14/27]
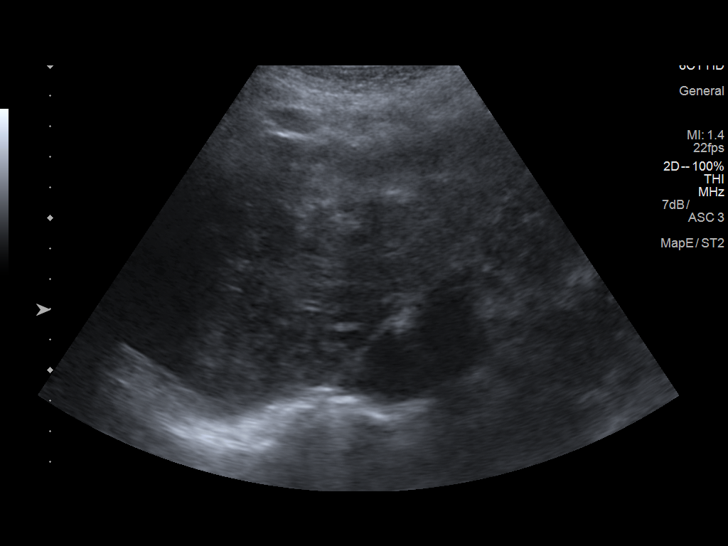
[im 16/27]
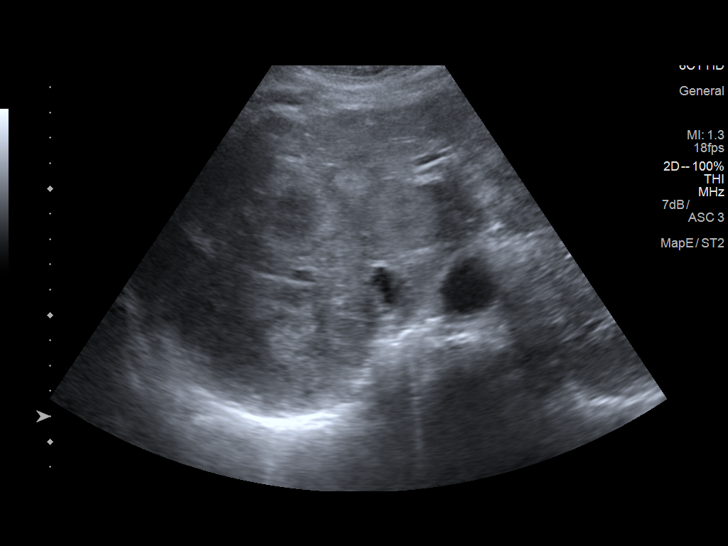
[im 18/27]
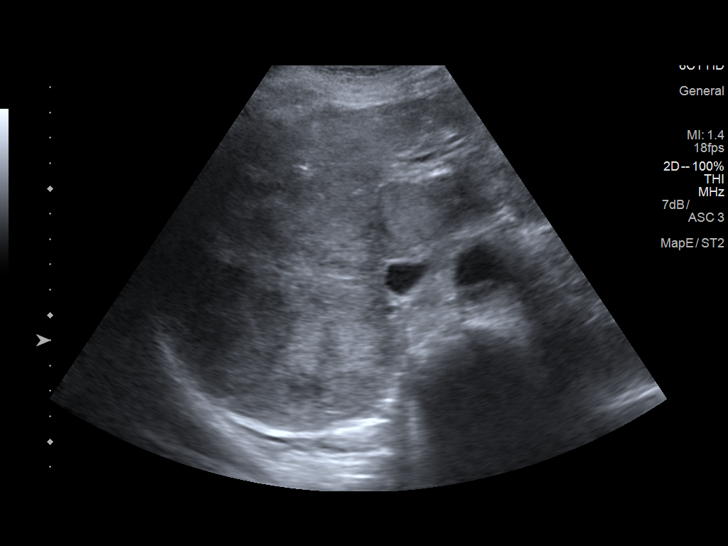
[im 20/27]
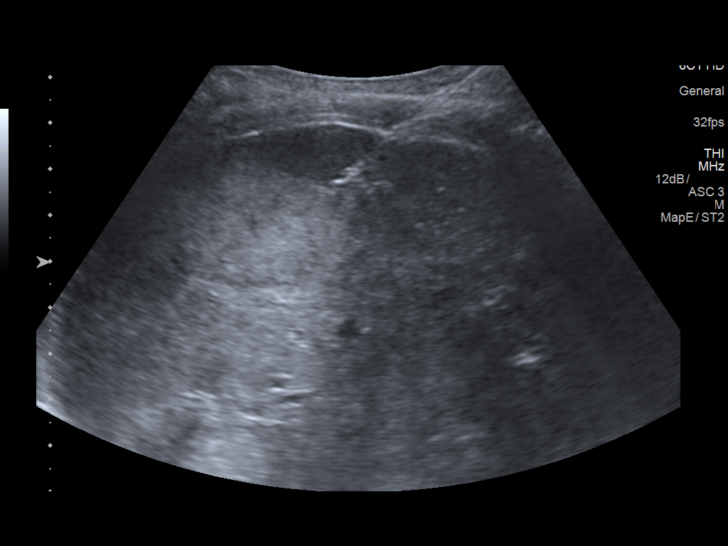
[im 22/27]
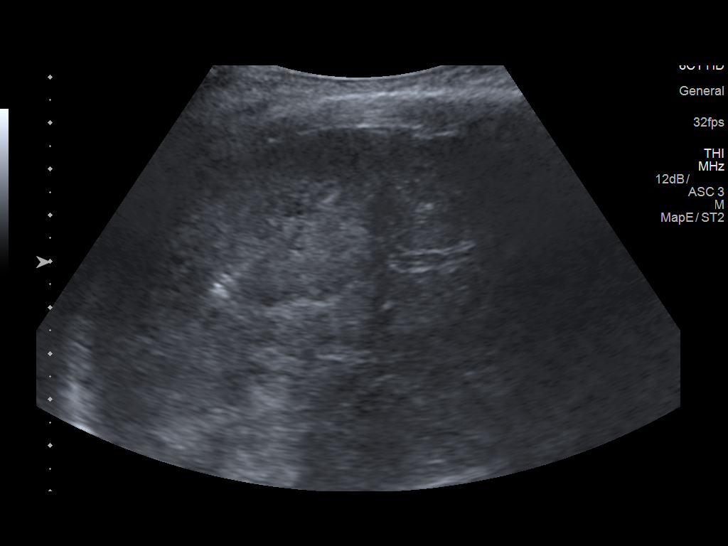
[im 24/27]
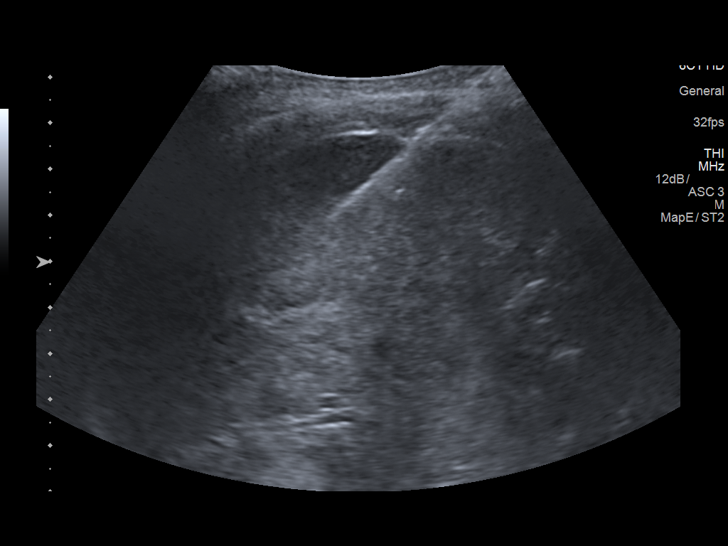
[im 27/27]
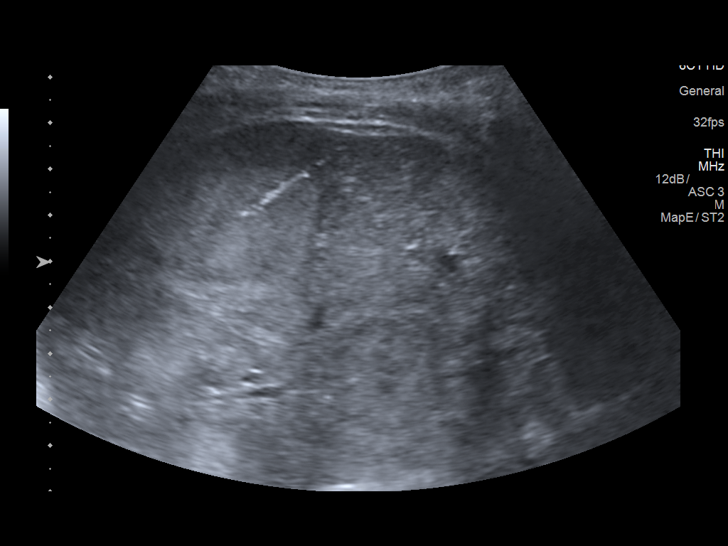

[13 of 25 positions shown; findings below may reference images not displayed]

EXAM:
ULTRASOUND GUIDED CORE BIOPSY OF LIVER LESION

MEDICATIONS:
Intravenous Fentanyl and Versed were administered as conscious
sedation during continuous monitoring of the patient's level of
consciousness and physiological / cardiorespiratory status by the
radiology RN, with a total moderate sedation time of 10 minutes.

PROCEDURE:
The procedure, risks, benefits, and alternatives were explained to
the patient. Questions regarding the procedure were encouraged and
answered. The patient understands and consents to the procedure.

Survey ultrasound of the liver was performed. A representative
lesion in the right lobe was localized. An appropriate skin entry
site was determined and marked.

The operative field was prepped with chlorhexidine in a sterile
fashion, and a sterile drape was applied covering the operative
field. A sterile gown and sterile gloves were used for the
procedure. Local anesthesia was provided with 1% Lidocaine.

Under real-time ultrasound guidance, a 17 gauge trocar needle was
advanced to the margin of the lesion. Once needle tip position was
confirmed, coaxial 18-gauge core biopsy samples were obtained,
submitted in formalin to surgical pathology. The guide needle was
removed. Postprocedure scans show no hemorrhage or other apparent
complication. The patient tolerated the procedure well.

COMPLICATIONS:
None.
FINDINGS: Multiple liver lesions were identified corresponding to recent CT
findings. Core biopsy of a representative right lobe lesion
obtained.
IMPRESSION: 1. Technically successful ultrasound-guided liver lesion core
biopsy.
